# Patient Record
Sex: Female | Born: 1935 | Race: White | Hispanic: No | Smoking: Former smoker
Health system: Southern US, Community
[De-identification: ages and names within clinical notes are randomized; demographics above are authoritative.]

## PROBLEM LIST (undated history)

## (undated) DIAGNOSIS — S22000A Wedge compression fracture of unspecified thoracic vertebra, initial encounter for closed fracture: Secondary | ICD-10-CM

## (undated) DIAGNOSIS — E785 Hyperlipidemia, unspecified: Secondary | ICD-10-CM

## (undated) DIAGNOSIS — J449 Chronic obstructive pulmonary disease, unspecified: Secondary | ICD-10-CM

## (undated) DIAGNOSIS — N8110 Cystocele, unspecified: Secondary | ICD-10-CM

## (undated) DIAGNOSIS — I10 Essential (primary) hypertension: Secondary | ICD-10-CM

## (undated) DIAGNOSIS — R300 Dysuria: Secondary | ICD-10-CM

## (undated) DIAGNOSIS — K589 Irritable bowel syndrome without diarrhea: Secondary | ICD-10-CM

## (undated) DIAGNOSIS — E039 Hypothyroidism, unspecified: Secondary | ICD-10-CM

## (undated) DIAGNOSIS — R339 Retention of urine, unspecified: Secondary | ICD-10-CM

## (undated) DIAGNOSIS — H409 Unspecified glaucoma: Secondary | ICD-10-CM

## (undated) DIAGNOSIS — N368 Other specified disorders of urethra: Secondary | ICD-10-CM

## (undated) DIAGNOSIS — F32A Depression, unspecified: Secondary | ICD-10-CM

## (undated) DIAGNOSIS — F329 Major depressive disorder, single episode, unspecified: Secondary | ICD-10-CM

## (undated) HISTORY — DX: Essential (primary) hypertension: I10

## (undated) HISTORY — DX: Wedge compression fracture of unspecified thoracic vertebra, initial encounter for closed fracture: S22.000A

## (undated) HISTORY — DX: Major depressive disorder, single episode, unspecified: F32.9

## (undated) HISTORY — DX: Unspecified glaucoma: H40.9

## (undated) HISTORY — DX: Retention of urine, unspecified: R33.9

## (undated) HISTORY — PX: SHOULDER SURGERY: SHX246

## (undated) HISTORY — DX: Hypothyroidism, unspecified: E03.9

## (undated) HISTORY — DX: Chronic obstructive pulmonary disease, unspecified: J44.9

## (undated) HISTORY — DX: Depression, unspecified: F32.A

## (undated) HISTORY — DX: Cystocele, unspecified: N81.10

## (undated) HISTORY — DX: Irritable bowel syndrome, unspecified: K58.9

## (undated) HISTORY — DX: Other specified disorders of urethra: N36.8

## (undated) HISTORY — PX: CARDIAC CATHETERIZATION: SHX172

## (undated) HISTORY — DX: Dysuria: R30.0

## (undated) HISTORY — DX: Hyperlipidemia, unspecified: E78.5

---

## 2004-11-06 ENCOUNTER — Ambulatory Visit: Payer: Self-pay | Admitting: Unknown Physician Specialty

## 2005-01-11 ENCOUNTER — Inpatient Hospital Stay: Payer: Self-pay | Admitting: Unknown Physician Specialty

## 2005-02-07 ENCOUNTER — Encounter: Payer: Self-pay | Admitting: Unknown Physician Specialty

## 2005-02-08 ENCOUNTER — Encounter: Payer: Self-pay | Admitting: Unknown Physician Specialty

## 2005-03-11 ENCOUNTER — Encounter: Payer: Self-pay | Admitting: Unknown Physician Specialty

## 2005-04-11 ENCOUNTER — Encounter: Payer: Self-pay | Admitting: Unknown Physician Specialty

## 2005-05-10 ENCOUNTER — Encounter: Payer: Self-pay | Admitting: Unknown Physician Specialty

## 2005-05-24 ENCOUNTER — Ambulatory Visit: Payer: Self-pay | Admitting: Family Medicine

## 2005-06-09 ENCOUNTER — Encounter: Payer: Self-pay | Admitting: Unknown Physician Specialty

## 2006-04-25 ENCOUNTER — Ambulatory Visit: Payer: Self-pay | Admitting: Family Medicine

## 2006-05-27 ENCOUNTER — Ambulatory Visit: Payer: Self-pay | Admitting: Family Medicine

## 2006-06-03 ENCOUNTER — Ambulatory Visit: Payer: Self-pay | Admitting: Gastroenterology

## 2007-06-02 ENCOUNTER — Ambulatory Visit: Payer: Self-pay | Admitting: Internal Medicine

## 2008-03-17 ENCOUNTER — Emergency Department: Payer: Self-pay | Admitting: Emergency Medicine

## 2008-03-22 ENCOUNTER — Ambulatory Visit: Payer: Self-pay

## 2008-03-29 ENCOUNTER — Emergency Department: Payer: Self-pay | Admitting: Internal Medicine

## 2008-07-13 ENCOUNTER — Ambulatory Visit: Payer: Self-pay | Admitting: Internal Medicine

## 2009-06-21 ENCOUNTER — Ambulatory Visit: Payer: Self-pay | Admitting: Unknown Physician Specialty

## 2009-07-14 ENCOUNTER — Ambulatory Visit: Payer: Self-pay | Admitting: Internal Medicine

## 2010-07-16 ENCOUNTER — Ambulatory Visit: Payer: Self-pay | Admitting: Internal Medicine

## 2011-03-21 ENCOUNTER — Ambulatory Visit: Payer: Self-pay | Admitting: Internal Medicine

## 2011-06-18 ENCOUNTER — Other Ambulatory Visit: Payer: Self-pay | Admitting: Neurosurgery

## 2011-06-18 DIAGNOSIS — M545 Low back pain, unspecified: Secondary | ICD-10-CM

## 2011-06-28 ENCOUNTER — Ambulatory Visit
Admission: RE | Admit: 2011-06-28 | Discharge: 2011-06-28 | Disposition: A | Discharge status: Home / Self Care | Payer: Medicare Other | Source: Ambulatory Visit | Attending: Neurosurgery | Admitting: Neurosurgery

## 2011-06-28 DIAGNOSIS — M545 Low back pain, unspecified: Secondary | ICD-10-CM

## 2011-07-01 ENCOUNTER — Other Ambulatory Visit (HOSPITAL_COMMUNITY): Payer: Self-pay | Admitting: Internal Medicine

## 2012-03-16 ENCOUNTER — Ambulatory Visit: Payer: Self-pay | Admitting: Obstetrics and Gynecology

## 2013-03-22 ENCOUNTER — Emergency Department: Payer: Self-pay | Admitting: Emergency Medicine

## 2013-04-02 ENCOUNTER — Encounter: Payer: Self-pay | Admitting: Orthopedic Surgery

## 2013-04-11 ENCOUNTER — Encounter: Payer: Self-pay | Admitting: Orthopedic Surgery

## 2013-05-26 ENCOUNTER — Emergency Department: Payer: Self-pay | Admitting: Internal Medicine

## 2013-10-19 DIAGNOSIS — K5909 Other constipation: Secondary | ICD-10-CM | POA: Insufficient documentation

## 2013-12-28 DIAGNOSIS — K589 Irritable bowel syndrome without diarrhea: Secondary | ICD-10-CM | POA: Insufficient documentation

## 2013-12-28 DIAGNOSIS — J449 Chronic obstructive pulmonary disease, unspecified: Secondary | ICD-10-CM | POA: Insufficient documentation

## 2013-12-28 DIAGNOSIS — E785 Hyperlipidemia, unspecified: Secondary | ICD-10-CM | POA: Insufficient documentation

## 2013-12-28 DIAGNOSIS — E039 Hypothyroidism, unspecified: Secondary | ICD-10-CM | POA: Insufficient documentation

## 2014-01-25 ENCOUNTER — Ambulatory Visit: Payer: Self-pay | Admitting: Internal Medicine

## 2014-02-22 ENCOUNTER — Ambulatory Visit: Payer: Self-pay | Admitting: Gastroenterology

## 2014-03-15 DIAGNOSIS — R933 Abnormal findings on diagnostic imaging of other parts of digestive tract: Secondary | ICD-10-CM | POA: Insufficient documentation

## 2014-03-15 DIAGNOSIS — R1013 Epigastric pain: Secondary | ICD-10-CM | POA: Insufficient documentation

## 2014-03-24 DIAGNOSIS — R300 Dysuria: Secondary | ICD-10-CM | POA: Insufficient documentation

## 2014-04-19 ENCOUNTER — Ambulatory Visit: Payer: Self-pay | Admitting: Gastroenterology

## 2014-07-04 LAB — SURGICAL PATHOLOGY

## 2014-08-11 DIAGNOSIS — IMO0002 Reserved for concepts with insufficient information to code with codable children: Secondary | ICD-10-CM | POA: Insufficient documentation

## 2014-08-19 ENCOUNTER — Ambulatory Visit: Payer: Self-pay | Admitting: Urology

## 2014-09-15 ENCOUNTER — Ambulatory Visit: Payer: Self-pay | Admitting: Urology

## 2014-10-19 ENCOUNTER — Ambulatory Visit: Payer: Self-pay | Admitting: Urology

## 2014-11-15 ENCOUNTER — Ambulatory Visit (INDEPENDENT_AMBULATORY_CARE_PROVIDER_SITE_OTHER): Payer: Medicare Other | Admitting: Urology

## 2014-11-15 ENCOUNTER — Encounter: Payer: Self-pay | Admitting: Urology

## 2014-11-15 VITALS — BP 172/104 | HR 71 | Ht 64.0 in | Wt 128.4 lb

## 2014-11-15 DIAGNOSIS — N8110 Cystocele, unspecified: Secondary | ICD-10-CM | POA: Diagnosis not present

## 2014-11-15 DIAGNOSIS — N368 Other specified disorders of urethra: Secondary | ICD-10-CM | POA: Diagnosis not present

## 2014-11-15 LAB — BLADDER SCAN AMB NON-IMAGING

## 2014-11-15 NOTE — Progress Notes (Signed)
11/15/2014 11:09 AM   Erica Perry 07-22-1924 [id number]  Referring provider: No referring provider defined for this encounter.  Chief Complaint  Patient presents with  . 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 The urethral caruncle is no longer friable Scott significantly smaller less reddened with the Estrace cream once a week. She should continue this and see [place] in 6 months    PMH: Past Medical History  Diagnosis Date  . IBS (irritable bowel syndrome)   . Hyperlipidemia   . Hypothyroidism   . Depression   . Glaucoma   . Compression fracture of thoracic vertebra   . Dysuria   . COPD (chronic obstructive pulmonary disease)   . Urethral prolapse   . Female bladder prolapse   . Hypertension   . Incomplete emptying of bladder     Surgical History: Past Surgical History  Procedure Laterality Date  . Shoulder surgery      Home Medications:    Medication List       This list is accurate as of: 11/15/14 11:09 AM.  Always use your most recent med list.               aspirin 81 MG tablet  Take 81 mg by mouth daily.     CALCIUM-VITAMIN D PO  Take 1 tablet by mouth daily.     clorazepate 7.5 MG tablet  Commonly known as:  TRANXENE  Take 7.5 mg by mouth 2 (two) times daily as needed for anxiety.     dorzolamide-timolol 22.3-6.8 MG/ML ophthalmic solution  Commonly known as:  COSOPT  1 drop 2 (two) times daily.     fluticasone 50 MCG/ACT nasal spray  Commonly known as:  FLONASE  Place into both nostrils daily.     levothyroxine 75 MCG tablet  Commonly known as:  SYNTHROID, LEVOTHROID  Take 75 mcg by mouth daily before breakfast.     MIRALAX PO  Take by mouth.     omeprazole 20 MG capsule  Commonly known as:  PRILOSEC  Take 20 mg by mouth daily.     simvastatin 40 MG tablet  Commonly known as:  ZOCOR  Take 40 mg by mouth daily.        Allergies:  Allergies  Allergen Reactions  . Meperidine  And Related   . Midazolam Hcl   . Sulfa Antibiotics   . Aleve [Naproxen Sodium] Rash  . Codeine Sulfate Rash    Family History: Family History  Problem Relation Age of Onset  . Prostate cancer Neg Hx   . Kidney cancer Neg Hx   . Bladder Cancer Neg Hx     Social History:  reports that she quit smoking about 15 months ago. Her smoking use included Cigarettes. She has a 15 pack-year smoking history. She does not have any smokeless tobacco history on file. She reports that she does not drink alcohol. Her drug history is not on file.  ROS: UROLOGY Frequent Urination?: No Hard to postpone urination?: No Burning/pain with urination?: No Get up at night to urinate?: Yes Leakage of urine?: No Urine stream starts and stops?: No Trouble starting stream?: No Do you have to strain to urinate?: Yes Blood in urine?: No Urinary tract infection?: No Sexually transmitted disease?: No Injury to kidneys or bladder?: No Painful intercourse?: No Weak stream?: No Currently pregnant?: No Vaginal bleeding?: No Last menstrual period?: n  Gastrointestinal Nausea?: No Vomiting?: No Indigestion/heartburn?: Yes Diarrhea?: No  Constipation?: Yes  Constitutional Fever: No Night sweats?: No Weight loss?: No Fatigue?: No  Skin Skin rash/lesions?: No Itching?: No  Eyes Blurred vision?: No Double vision?: Yes  Ears/Nose/Throat Sore throat?: No Sinus problems?: No  Hematologic/Lymphatic Swollen glands?: No Easy bruising?: No  Cardiovascular Leg swelling?: No Chest pain?: No  Respiratory Cough?: No Shortness of breath?: No  Endocrine Excessive thirst?: No  Musculoskeletal Back pain?: Yes Joint pain?: No  Neurological Headaches?: No Dizziness?: No  Psychologic Depression?: No Anxiety?: Yes  Physical Exam: BP 172/104 mmHg  Pulse 71  Ht 5\' 4"  (1.626 m)  Wt 128 lb 6.4 oz (58.242 kg)  BMI 22.03 kg/m2  Constitutional:  Alert and oriented, No acute distress. HEENT:  [place] AT, moist mucus membranes.  Trachea midline, no masses. Cardiovascular: No clubbing, cyanosis, or edema. Respiratory: Normal respiratory effort, no increased work of breathing. GI: Abdomen is soft, nontender, nondistended, no abdominal masses GU: No CVA tenderness. Smaller urethral carbuncle atrophic senile vaginitis Skin: No rashes, bruises or suspicious lesions. Lymph: No cervical or inguinal adenopathy. Neurologic: Grossly intact, no focal deficits, moving all 4 extremities. Psychiatric: Normal mood and affect.  Laboratory Data: No results found for: WBC, HGB, HCT, MCV, PLT  No results found for: CREATININE  No results found for: PSA  No results found for: TESTOSTERONE  No results found for: HGBA1C  Urinalysis No results found for: COLORURINE, APPEARANCEUR, LABSPEC, PHURINE, GLUCOSEU, HGBUR, BILIRUBINUR, KETONESUR, PROTEINUR, UROBILINOGEN, NITRITE, LEUKOCYTESUR  Pertinent Imaging: None  Assessment & Plan:  Stable urethral carbuncle area patient unable to void here today although she voids well at home. She is taking her urinalysis cup home with her and we'll try to void at her house and then bring her sample in here tomorrow follow-up is in 6 months. She is to use her Estrace cream once weekly. Her caruncle has improved immensely is no longer friable  1. Female bladder prolapse Urethral carbuncle no bladder prolapse - Urinalysis, Complete - BLADDER SCAN AMB NON-IMAGING   No Follow-up on file.  Lorraine Lax, MD  [facility] [address] [place], [place] [address] [phone]

## 2014-11-16 LAB — MICROSCOPIC EXAMINATION
Bacteria, UA: NONE SEEN
RBC, UA: NONE SEEN /hpf (ref 0–?)
WBC, UA: NONE SEEN /hpf (ref 0–?)

## 2014-11-16 LAB — URINALYSIS, COMPLETE
Bilirubin, UA: NEGATIVE
GLUCOSE, UA: NEGATIVE
KETONES UA: NEGATIVE
LEUKOCYTES UA: NEGATIVE
Nitrite, UA: NEGATIVE
PROTEIN UA: NEGATIVE
RBC, UA: NEGATIVE
SPEC GRAV UA: 1.01 (ref 1.005–1.030)
Urobilinogen, Ur: 0.2 mg/dL (ref 0.2–1.0)
pH, UA: 6.5 (ref 5.0–7.5)

## 2015-02-03 ENCOUNTER — Encounter: Payer: Self-pay | Admitting: Emergency Medicine

## 2015-02-03 ENCOUNTER — Emergency Department: Payer: Medicare Other

## 2015-02-03 ENCOUNTER — Emergency Department
Admission: EM | Admit: 2015-02-03 | Discharge: 2015-02-03 | Disposition: A | Discharge status: Home / Self Care | Payer: Medicare Other | Attending: Emergency Medicine | Admitting: Emergency Medicine

## 2015-02-03 DIAGNOSIS — M7918 Myalgia, other site: Secondary | ICD-10-CM

## 2015-02-03 DIAGNOSIS — S8992XA Unspecified injury of left lower leg, initial encounter: Secondary | ICD-10-CM | POA: Diagnosis present

## 2015-02-03 DIAGNOSIS — Y998 Other external cause status: Secondary | ICD-10-CM | POA: Diagnosis not present

## 2015-02-03 DIAGNOSIS — W19XXXA Unspecified fall, initial encounter: Secondary | ICD-10-CM

## 2015-02-03 DIAGNOSIS — I10 Essential (primary) hypertension: Secondary | ICD-10-CM | POA: Insufficient documentation

## 2015-02-03 DIAGNOSIS — W102XXA Fall (on)(from) incline, initial encounter: Secondary | ICD-10-CM | POA: Insufficient documentation

## 2015-02-03 DIAGNOSIS — Y9301 Activity, walking, marching and hiking: Secondary | ICD-10-CM | POA: Diagnosis not present

## 2015-02-03 DIAGNOSIS — Z79899 Other long term (current) drug therapy: Secondary | ICD-10-CM | POA: Insufficient documentation

## 2015-02-03 DIAGNOSIS — Y9289 Other specified places as the place of occurrence of the external cause: Secondary | ICD-10-CM | POA: Insufficient documentation

## 2015-02-03 DIAGNOSIS — S80211A Abrasion, right knee, initial encounter: Secondary | ICD-10-CM | POA: Diagnosis not present

## 2015-02-03 DIAGNOSIS — S80212A Abrasion, left knee, initial encounter: Secondary | ICD-10-CM | POA: Diagnosis not present

## 2015-02-03 DIAGNOSIS — Z7982 Long term (current) use of aspirin: Secondary | ICD-10-CM | POA: Insufficient documentation

## 2015-02-03 DIAGNOSIS — Z87891 Personal history of nicotine dependence: Secondary | ICD-10-CM | POA: Insufficient documentation

## 2015-02-03 MED ORDER — TRAMADOL HCL 50 MG PO TABS: 50.0000 mg | ORAL_TABLET | Freq: Four times a day (QID) | ORAL | Status: DC | PRN

## 2015-02-03 MED ORDER — IBUPROFEN 600 MG PO TABS
600.0000 mg | ORAL_TABLET | Freq: Once | ORAL | Status: AC
  Administered 2015-02-03: 600 mg via ORAL
  Filled 2015-02-03: qty 1

## 2015-02-03 MED ORDER — TRAMADOL HCL 50 MG PO TABS
50.0000 mg | ORAL_TABLET | Freq: Once | ORAL | Status: AC
  Administered 2015-02-03: 50 mg via ORAL
  Filled 2015-02-03: qty 1

## 2015-02-03 NOTE — Discharge Instructions (Signed)

## 2015-02-03 NOTE — ED Notes (Signed)
Pt went to X-Ray.  

## 2015-02-03 NOTE — ED Provider Notes (Signed)
[facility] Emergency Department Provider Note  ____________________________________________  Time seen: Approximately 514 AM  I have reviewed the triage vital signs and the nursing notes.   HISTORY  Chief Complaint Fall    HPI Erica Perry is a 81 y.o. female who comes into the hospital today after a fall. The patient reports that she was coming down a ramp that she had previously walked up without any difficulty and there was place on the ramp that was not level. The patient reports that she lost her footing and she fell. The patient reports that she did not hit her head but she fell on her knees and rolled onto her left side. The patient reports that initially she felt fine and was able to go through the rest of the day without any difficulty. She reports that this occurred approximate 5:30. She reports that she took some Tylenol but after she was taken home she's been up all night with pain. She reports that 7 pain in her bilateral knees and on the right side of her chest under her arm. She reports her pain as a 7-8 out of 10 in intensity. The patient reports that she is unable to sleep so she decided to come in for evaluation.   Past Medical History  Diagnosis Date  . IBS (irritable bowel syndrome)   . Hyperlipidemia   . Hypothyroidism   . Depression   . Glaucoma   . Compression fracture of thoracic vertebra (HCC)   . Dysuria   . COPD (chronic obstructive pulmonary disease) (HCC)   . Urethral prolapse   . Female bladder prolapse   . Hypertension   . Incomplete emptying of bladder     Patient Active Problem List   Diagnosis Date Noted  . Compression fracture 08/11/2014  . Difficult or painful urination 03/24/2014  . Abnormal finding on GI tract imaging 03/15/2014  . Abdominal pain, epigastric 03/15/2014  . CAFL (chronic airflow limitation) (HCC) 12/28/2013  . HLD (hyperlipidemia) 12/28/2013  . Adult hypothyroidism 12/28/2013  . Adaptive  colitis 12/28/2013  . Chronic constipation 10/19/2013    Past Surgical History  Procedure Laterality Date  . Shoulder surgery      Current Outpatient Rx  Name  Route  Sig  Dispense  Refill  . aspirin 81 MG tablet   Oral   Take 81 mg by mouth daily.         Marland Kitchen CALCIUM-VITAMIN D PO   Oral   Take 1 tablet by mouth daily.         . clorazepate (TRANXENE) 7.5 MG tablet   Oral   Take 7.5 mg by mouth 2 (two) times daily as needed for anxiety.         . dorzolamide-timolol (COSOPT) 22.3-6.8 MG/ML ophthalmic solution      1 drop 2 (two) times daily.         . fluticasone (FLONASE) 50 MCG/ACT nasal spray   Each Nare   Place into both nostrils daily.         Marland Kitchen levothyroxine (SYNTHROID, LEVOTHROID) 75 MCG tablet   Oral   Take 75 mcg by mouth daily before breakfast.         . omeprazole (PRILOSEC) 20 MG capsule   Oral   Take 20 mg by mouth daily.         . Polyethylene Glycol 3350 (MIRALAX PO)   Oral   Take by mouth.         Marland Kitchen  simvastatin (ZOCOR) 40 MG tablet   Oral   Take 40 mg by mouth daily.         . traMADol (ULTRAM) 50 MG tablet   Oral   Take 1 tablet (50 mg total) by mouth every 6 (six) hours as needed.   12 tablet   0     Allergies Meperidine and related; Midazolam hcl; Sulfa antibiotics; Aleve; and Codeine sulfate  Family History  Problem Relation Age of Onset  . Prostate cancer Neg Hx   . Kidney cancer Neg Hx   . Bladder Cancer Neg Hx     Social History Social History  Substance Use Topics  . Smoking status: Former Smoker -- 0.50 packs/day for 30 years    Types: Cigarettes    Quit date: 08/09/2013  . Smokeless tobacco: None  . Alcohol Use: No    Review of Systems Constitutional: No fever/chills Eyes: No visual changes. ENT: No sore throat. Cardiovascular: Right-sided chest pain Respiratory: Denies shortness of breath. Gastrointestinal: No abdominal pain.  No nausea, no vomiting.  No diarrhea.  No  constipation. Genitourinary: Negative for dysuria. Musculoskeletal: Bilateral knee pain Skin: Bilateral knee abrasions Neurological: Negative for headaches, focal weakness or numbness. 10-point ROS otherwise negative.  ____________________________________________   PHYSICAL EXAM:  VITAL SIGNS: ED Triage Vitals  Enc Vitals Group     BP 02/03/15 0456 172/87 mmHg     Pulse Rate 02/03/15 0456 95     Resp 02/03/15 0456 18     Temp 02/03/15 0456 97.7 F (36.5 C)     Temp Source 02/03/15 0456 Oral     SpO2 02/03/15 0456 96 %     Weight 02/03/15 0456 128 lb 12 oz (58.4 kg)     Height 02/03/15 0456 5\' 4"  (1.626 m)     Head Cir --      Peak Flow --      Pain Score 02/03/15 0457 8     Pain Loc --      Pain Edu? --      Excl. in GC? --     Constitutional: Alert and oriented. Well appearing and in mild distress. Eyes: Conjunctivae are normal. PERRL. EOMI. Head: Atraumatic. Nose: No congestion/rhinnorhea. Mouth/Throat: Mucous membranes are moist.  Oropharynx non-erythematous. Cardiovascular: Normal rate, regular rhythm. Grossly normal heart sounds.  Good peripheral circulation. Respiratory: Normal respiratory effort.  No retractions. Lungs CTAB. Gastrointestinal: Soft and nontender. No distention. Positive bowel sounds Musculoskeletal: Bilateral knee tenderness palpation with abrasions bilaterally. Neurologic:  Normal speech and language.  Skin:  Abrasions to bilateral knees with no contusion to right chest wall. Psychiatric: Mood and affect are normal.   ____________________________________________   LABS (all labs ordered are listed, but only abnormal results are displayed)  Labs Reviewed - No data to display ____________________________________________  EKG  ED ECG REPORT I, Rebecka Apley, the attending physician, personally viewed and interpreted this ECG.   Date: 02/03/2015  EKG Time: 457  Rate: 94  Rhythm: normal sinus rhythm  Axis: Normal  Intervals:left  anterior fascicular block  ST&T Change: None  ____________________________________________  RADIOLOGY  Right knee x-ray: No evidence of fracture or dislocation, scattered vascular calcifications seen Left knee x-ray: No evidence of fracture or dislocation scattered vascular calcifications seen Chest x-ray: Mild bibasilar atelectasis noted, lungs otherwise clear, no definite displaced rib fracture seen. ____________________________________________   PROCEDURES  Procedure(s) performed: None  Critical Care performed: No  ____________________________________________   INITIAL IMPRESSION / ASSESSMENT AND PLAN / ED  COURSE  Pertinent labs & imaging results that were available during my care of the patient were reviewed by me and considered in my medical decision making (see chart for details).  This is an 81 year old female who comes in today after a fall this afternoon. The patient complaining of pain in her right side as well as her bilateral knees down her legs. The patient does not have any acute fracture at this time. I feel the patient is 6 be discharged home and can follow up with her primary care physician. I did give the patient a dose of tramadol as well as some ibuprofen for her pain. The patient will be discharged to follow-up. ____________________________________________   FINAL CLINICAL IMPRESSION(S) / ED DIAGNOSES  Final diagnoses:  Musculoskeletal pain  Fall, initial encounter      Rebecka Apley, MD 02/03/15 [phone]

## 2015-02-03 NOTE — ED Notes (Signed)
Pt returned from X Ray.

## 2015-02-03 NOTE — ED Notes (Signed)
Pt arrived to the ED via EMS for complaints of bilateral lower leg pain and right flank pain secondary to a mechanical fall sustained yesterday. Pt reports that she fell after a miss step on uneven pavement. Pt denies LOC or any other symptom. Pt is AOx4 in no apparent distress.

## 2015-05-05 ENCOUNTER — Encounter: Payer: Self-pay | Admitting: *Deleted

## 2015-05-15 ENCOUNTER — Ambulatory Visit: Payer: Medicare Other | Admitting: Obstetrics and Gynecology

## 2015-10-03 ENCOUNTER — Other Ambulatory Visit: Payer: Self-pay | Admitting: Internal Medicine

## 2015-10-03 DIAGNOSIS — Z1231 Encounter for screening mammogram for malignant neoplasm of breast: Secondary | ICD-10-CM

## 2015-10-23 ENCOUNTER — Ambulatory Visit: Payer: Medicare Other

## 2015-11-03 ENCOUNTER — Other Ambulatory Visit: Payer: Self-pay | Admitting: Internal Medicine

## 2015-11-03 ENCOUNTER — Ambulatory Visit
Admission: RE | Admit: 2015-11-03 | Discharge: 2015-11-03 | Disposition: A | Discharge status: Home / Self Care | Payer: Medicare Other | Source: Ambulatory Visit | Attending: Internal Medicine | Admitting: Internal Medicine

## 2015-11-03 DIAGNOSIS — Z1231 Encounter for screening mammogram for malignant neoplasm of breast: Secondary | ICD-10-CM

## 2016-03-02 ENCOUNTER — Emergency Department: Payer: Medicare Other

## 2016-03-02 ENCOUNTER — Encounter: Payer: Self-pay | Admitting: Medical Oncology

## 2016-03-02 ENCOUNTER — Emergency Department
Admission: EM | Admit: 2016-03-02 | Discharge: 2016-03-02 | Disposition: A | Discharge status: Home / Self Care | Payer: Medicare Other | Attending: Student in an Organized Health Care Education/Training Program | Admitting: Student in an Organized Health Care Education/Training Program

## 2016-03-02 DIAGNOSIS — E039 Hypothyroidism, unspecified: Secondary | ICD-10-CM | POA: Insufficient documentation

## 2016-03-02 DIAGNOSIS — I10 Essential (primary) hypertension: Secondary | ICD-10-CM | POA: Diagnosis not present

## 2016-03-02 DIAGNOSIS — Z79899 Other long term (current) drug therapy: Secondary | ICD-10-CM | POA: Insufficient documentation

## 2016-03-02 DIAGNOSIS — Z7982 Long term (current) use of aspirin: Secondary | ICD-10-CM | POA: Insufficient documentation

## 2016-03-02 DIAGNOSIS — J449 Chronic obstructive pulmonary disease, unspecified: Secondary | ICD-10-CM | POA: Diagnosis not present

## 2016-03-02 DIAGNOSIS — Z87891 Personal history of nicotine dependence: Secondary | ICD-10-CM | POA: Diagnosis not present

## 2016-03-02 DIAGNOSIS — R0789 Other chest pain: Secondary | ICD-10-CM | POA: Diagnosis not present

## 2016-03-02 DIAGNOSIS — K5900 Constipation, unspecified: Secondary | ICD-10-CM | POA: Diagnosis not present

## 2016-03-02 DIAGNOSIS — R1013 Epigastric pain: Secondary | ICD-10-CM | POA: Diagnosis present

## 2016-03-02 LAB — BASIC METABOLIC PANEL
ANION GAP: 11 (ref 5–15)
BUN: <5 mg/dL — ABNORMAL LOW (ref 6–20)
CHLORIDE: 95 mmol/L — AB (ref 101–111)
CO2: 24 mmol/L (ref 22–32)
Calcium: 9 mg/dL (ref 8.9–10.3)
Creatinine, Ser: 0.54 mg/dL (ref 0.44–1.00)
GFR calc non Af Amer: >60 mL/min (ref >60)
Glucose, Bld: 113 mg/dL — ABNORMAL HIGH (ref 65–99)
POTASSIUM: 3.3 mmol/L — AB (ref 3.5–5.1)
SODIUM: 130 mmol/L — AB (ref 135–145)

## 2016-03-02 LAB — CBC
HEMATOCRIT: 39.6 % (ref 35.0–47.0)
Hemoglobin: 13.5 g/dL (ref 12.0–16.0)
MCH: 29.9 pg (ref 26.0–34.0)
MCHC: 34 g/dL (ref 32.0–36.0)
MCV: 88.1 fL (ref 80.0–100.0)
PLATELETS: 340 10*3/uL (ref 150–440)
RBC: 4.5 MIL/uL (ref 3.80–5.20)
RDW: 13.6 % (ref 11.5–14.5)
WBC: 13.6 10*3/uL — AB (ref 3.6–11.0)

## 2016-03-02 LAB — TROPONIN I: Troponin I: <0.03 ng/mL (ref <0.03)

## 2016-03-02 MED ORDER — MAGNESIUM CITRATE PO SOLN
1.0000 | Freq: Once | ORAL | Status: AC
Start: 2016-03-02 — End: 2016-03-02
  Administered 2016-03-02: 1 via ORAL
  Filled 2016-03-02: qty 296

## 2016-03-02 MED ORDER — DIPHENHYDRAMINE HCL 25 MG PO CAPS
25.0000 mg | ORAL_CAPSULE | Freq: Once | ORAL | Status: AC
  Administered 2016-03-02: 25 mg via ORAL
  Filled 2016-03-02: qty 1

## 2016-03-02 MED ORDER — ONDANSETRON HCL 4 MG/2ML IJ SOLN
4.0000 mg | Freq: Once | INTRAMUSCULAR | Status: AC
  Administered 2016-03-02: 4 mg via INTRAVENOUS
  Filled 2016-03-02: qty 2

## 2016-03-02 MED ORDER — BISACODYL 10 MG RE SUPP
10.0000 mg | Freq: Once | RECTAL | Status: AC
  Administered 2016-03-02: 10 mg via RECTAL
  Filled 2016-03-02 (×2): qty 1

## 2016-03-02 MED ORDER — BISACODYL 5 MG PO TBEC: 5.0000 mg | DELAYED_RELEASE_TABLET | Freq: Every day | ORAL | 0 refills | Status: AC | PRN

## 2016-03-02 MED ORDER — IOPAMIDOL (ISOVUE-300) INJECTION 61%
100.0000 mL | Freq: Once | INTRAVENOUS | Status: AC | PRN
  Administered 2016-03-02: 100 mL via INTRAVENOUS

## 2016-03-02 MED ORDER — IPRATROPIUM-ALBUTEROL 0.5-2.5 (3) MG/3ML IN SOLN
3.0000 mL | Freq: Once | RESPIRATORY_TRACT | Status: AC
  Administered 2016-03-02: 3 mL via RESPIRATORY_TRACT
  Filled 2016-03-02: qty 3

## 2016-03-02 NOTE — ED Provider Notes (Signed)
[facility] Emergency Department Provider Note    First MD Initiated Contact with Patient 03/02/16 1559     (approximate)  I have reviewed the triage vital signs and the nursing notes.   HISTORY  Chief Complaint Chest Pain; Back Pain; and Constipation    HPI Erica Perry is a 82 y.o. female with a history of COPD presents with 3 days of constipation as well as epigastric discomfort causing pain in her midsternal area radiating through to her back. She denies any worsening shortness of breath. Does have a history of COPD. No recent fevers. Denies any nausea or vomiting. Denies any pleuritic chest pain. No pain worsened with exertion. No diaphoresis or pain radiating to her jaw or right shoulder.   Past Medical History:  Diagnosis Date  . Compression fracture of thoracic vertebra (HCC)   . COPD (chronic obstructive pulmonary disease) (HCC)   . Depression   . Dysuria   . Female bladder prolapse   . Glaucoma   . Hyperlipidemia   . Hypertension   . Hypothyroidism   . IBS (irritable bowel syndrome)   . Incomplete emptying of bladder   . Urethral prolapse    Family History  Problem Relation Age of Onset  . Breast cancer Other 42  . Prostate cancer Neg Hx   . Kidney cancer Neg Hx   . Bladder Cancer Neg Hx    Past Surgical History:  Procedure Laterality Date  . SHOULDER SURGERY     Patient Active Problem List   Diagnosis Date Noted  . Compression fracture 08/11/2014  . Difficult or painful urination 03/24/2014  . Abnormal finding on GI tract imaging 03/15/2014  . Abdominal pain, epigastric 03/15/2014  . CAFL (chronic airflow limitation) (HCC) 12/28/2013  . HLD (hyperlipidemia) 12/28/2013  . Adult hypothyroidism 12/28/2013  . Adaptive colitis 12/28/2013  . Chronic obstructive pulmonary disease (HCC) 12/28/2013  . Chronic constipation 10/19/2013      Prior to Admission medications   Medication Sig Start Date End Date Taking?  Authorizing Provider  aspirin 81 MG tablet Take 81 mg by mouth daily.    Historical Provider, MD  CALCIUM-VITAMIN D PO Take 1 tablet by mouth daily.    Historical Provider, MD  clorazepate (TRANXENE) 7.5 MG tablet Take 7.5 mg by mouth 2 (two) times daily as needed for anxiety.    Historical Provider, MD  dorzolamide-timolol (COSOPT) 22.3-6.8 MG/ML ophthalmic solution 1 drop 2 (two) times daily.    Historical Provider, MD  fluticasone (FLONASE) 50 MCG/ACT nasal spray Place into both nostrils daily.    Historical Provider, MD  levothyroxine (SYNTHROID, LEVOTHROID) 75 MCG tablet Take 75 mcg by mouth daily before breakfast.    Historical Provider, MD  omeprazole (PRILOSEC) 20 MG capsule Take 20 mg by mouth daily.    Historical Provider, MD  Polyethylene Glycol 3350 (MIRALAX PO) Take by mouth.    Historical Provider, MD  simvastatin (ZOCOR) 40 MG tablet Take 40 mg by mouth daily.    Historical Provider, MD  traMADol (ULTRAM) 50 MG tablet Take 1 tablet (50 mg total) by mouth every 6 (six) hours as needed. 02/03/15   Rebecka Apley, MD    Allergies Meperidine and related; Midazolam hcl; Sulfa antibiotics; Aleve [naproxen sodium]; and Codeine sulfate    Social History Social History  Substance Use Topics  . Smoking status: Former Smoker    Packs/day: 0.50    Years: 30.00    Types: Cigarettes    Quit date:  08/09/2013  . Smokeless tobacco: Not on file  . Alcohol use No    Review of Systems Patient denies headaches, rhinorrhea, blurry vision, numbness, shortness of breath, chest pain, edema, cough, abdominal pain, nausea, vomiting, diarrhea, dysuria, fevers, rashes or hallucinations unless otherwise stated above in HPI. ____________________________________________   PHYSICAL EXAM:  VITAL SIGNS: Vitals:   03/02/16 1420  BP: (!) 189/102  Pulse: 92  Resp: 18  Temp: 98 F (36.7 C)    Constitutional: Alert and oriented. Well appearing and in no acute distress. Eyes: Conjunctivae are  normal. PERRL. EOMI. Head: Atraumatic. Nose: No congestion/rhinnorhea. Mouth/Throat: Mucous membranes are moist.  Oropharynx non-erythematous. Neck: No stridor. Painless ROM. No cervical spine tenderness to palpation Hematological/Lymphatic/Immunilogical: No cervical lymphadenopathy. Cardiovascular: Normal rate, regular rhythm. Grossly normal heart sounds.  Good peripheral circulation. Respiratory: Normal respiratory effort.  No retractions. Lungs CTAB. Gastrointestinal: Soft and nontender. No distention. No abdominal bruits. No CVA tenderness.  No rectal mass, there is soft formed stool in the rectum Musculoskeletal: No lower extremity tenderness nor edema.  No joint effusions. Neurologic:  Normal speech and language. No gross focal neurologic deficits are appreciated. No gait instability. Skin:  Skin is warm, dry and intact. No rash noted. Psychiatric: Mood and affect are normal. Speech and behavior are normal.  ____________________________________________   LABS (all labs ordered are listed, but only abnormal results are displayed)  Results for orders placed or performed during the hospital encounter of 03/02/16 (from the past 24 hour(s))  Basic metabolic panel     Status: Abnormal   Collection Time: 03/02/16  2:23 PM  Result Value Ref Range   Sodium 130 (L) 135 - 145 mmol/L   Potassium 3.3 (L) 3.5 - 5.1 mmol/L   Chloride 95 (L) 101 - 111 mmol/L   CO2 24 22 - 32 mmol/L   Glucose, Bld 113 (H) 65 - 99 mg/dL   BUN <5 (L) 6 - 20 mg/dL   Creatinine, Ser 1.61 0.44 - 1.00 mg/dL   Calcium 9.0 8.9 - 09.6 mg/dL   GFR calc non Af Amer >60 >60 mL/min   GFR calc Af Amer >60 >60 mL/min   Anion gap 11 5 - 15  CBC     Status: Abnormal   Collection Time: 03/02/16  2:23 PM  Result Value Ref Range   WBC 13.6 (H) 3.6 - 11.0 K/uL   RBC 4.50 3.80 - 5.20 MIL/uL   Hemoglobin 13.5 12.0 - 16.0 g/dL   HCT 04.5 40.9 - 81.1 %   MCV 88.1 80.0 - 100.0 fL   MCH 29.9 26.0 - 34.0 pg   MCHC 34.0 32.0 -  36.0 g/dL   RDW 91.4 78.2 - 95.6 %   Platelets 340 150 - 440 K/uL  Troponin I     Status: None   Collection Time: 03/02/16  2:23 PM  Result Value Ref Range   Troponin I <0.03 <0.03 ng/mL   ____________________________________________  EKG My review and personal interpretation at Time: 14:25   Indication: chest pain  Rate: 90  Rhythm: sinus Axis: normal Other: non specific st changes, normal intervals. ____________________________________________  RADIOLOGY  I personally reviewed all radiographic images ordered to evaluate for the above acute complaints and reviewed radiology reports and findings.  These findings were personally discussed with the patient.  Please see medical record for radiology report.  ____________________________________________   PROCEDURES  Procedure(s) performed:  Procedures    Critical Care performed: no ____________________________________________   INITIAL IMPRESSION / ASSESSMENT AND  PLAN / ED COURSE  Pertinent labs & imaging results that were available during my care of the patient were reviewed by me and considered in my medical decision making (see chart for details).  DDX: acs, pna, copd, obstruction,   Erica Perry is a 82 y.o. who presents to the ED with above complaints.  No evidence of stool impaction on rectal exam. EKG without any evidence of acute ischemia and troponin is negative. Patient does have mild leukocytosis. No evidence of hypoxia. She has no signs or symptoms of aortic dissection and she has a crisp aortic knob with equal pulses bilaterally no neuro deficits. Based on her constipation without fecal impaction I am concerned for possible diverticulitis or obstructive lesion. We'll order CT scan to evaluate.  Clinical Course as of Mar 02 2024  Sat Mar 02, 2016  1843 Patient with repeat troponin that is negative.  Patient in NAD.  Repeat abdominal exam is soft and benign.  Leukocytosis is of uncertain etiology.  No evidence  of acute infectious process.  Patient was able to tolerate PO and was able to ambulate with a steady gait.  Have discussed with the patient and available family all diagnostics and treatments performed thus far and all questions were answered to the best of my ability. The patient demonstrates understanding and agreement with plan.   [PR]    Clinical Course User Index [PR] Willy EddyPatrick Janelis Stelzer, MD     ____________________________________________   FINAL CLINICAL IMPRESSION(S) / ED DIAGNOSES  Final diagnoses:  Constipation, unspecified constipation type  Atypical chest pain      NEW MEDICATIONS STARTED DURING THIS VISIT:  New Prescriptions   No medications on file     Note:  This document was prepared using Dragon voice recognition software and may include unintentional dictation errors.    Willy EddyPatrick Louie Flenner, MD 03/02/16 2025

## 2016-03-02 NOTE — ED Triage Notes (Signed)
Pt reports that she began having chest pressure this am with radiation of pain in between shoulder blades. Pt reports that she feels like she is constipated and that is what is causing the pain. Pt has not had BM in 3 days. Pt denies sob.

## 2016-03-02 NOTE — ED Notes (Signed)
Pt. Going home with neighbor.

## 2016-03-09 ENCOUNTER — Emergency Department: Payer: Medicare Other

## 2016-03-09 ENCOUNTER — Emergency Department
Admission: EM | Admit: 2016-03-09 | Discharge: 2016-03-09 | Disposition: A | Discharge status: Home / Self Care | Payer: Medicare Other | Attending: Emergency Medicine | Admitting: Emergency Medicine

## 2016-03-09 DIAGNOSIS — R0789 Other chest pain: Secondary | ICD-10-CM | POA: Insufficient documentation

## 2016-03-09 DIAGNOSIS — G8929 Other chronic pain: Secondary | ICD-10-CM

## 2016-03-09 DIAGNOSIS — J449 Chronic obstructive pulmonary disease, unspecified: Secondary | ICD-10-CM | POA: Diagnosis not present

## 2016-03-09 DIAGNOSIS — E871 Hypo-osmolality and hyponatremia: Secondary | ICD-10-CM

## 2016-03-09 DIAGNOSIS — Z79899 Other long term (current) drug therapy: Secondary | ICD-10-CM | POA: Insufficient documentation

## 2016-03-09 DIAGNOSIS — E039 Hypothyroidism, unspecified: Secondary | ICD-10-CM | POA: Diagnosis not present

## 2016-03-09 DIAGNOSIS — M549 Dorsalgia, unspecified: Secondary | ICD-10-CM | POA: Diagnosis present

## 2016-03-09 DIAGNOSIS — Z87891 Personal history of nicotine dependence: Secondary | ICD-10-CM | POA: Diagnosis not present

## 2016-03-09 DIAGNOSIS — I10 Essential (primary) hypertension: Secondary | ICD-10-CM | POA: Insufficient documentation

## 2016-03-09 DIAGNOSIS — M546 Pain in thoracic spine: Secondary | ICD-10-CM | POA: Diagnosis not present

## 2016-03-09 LAB — BASIC METABOLIC PANEL
Anion gap: 10 (ref 5–15)
BUN: <5 mg/dL — ABNORMAL LOW (ref 6–20)
CALCIUM: 8.5 mg/dL — AB (ref 8.9–10.3)
CHLORIDE: 91 mmol/L — AB (ref 101–111)
CO2: 26 mmol/L (ref 22–32)
CREATININE: 0.58 mg/dL (ref 0.44–1.00)
Glucose, Bld: 104 mg/dL — ABNORMAL HIGH (ref 65–99)
Potassium: 3.5 mmol/L (ref 3.5–5.1)
SODIUM: 127 mmol/L — AB (ref 135–145)

## 2016-03-09 LAB — CBC
HCT: 39.7 % (ref 35.0–47.0)
Hemoglobin: 13.6 g/dL (ref 12.0–16.0)
MCH: 29.9 pg (ref 26.0–34.0)
MCHC: 34.2 g/dL (ref 32.0–36.0)
MCV: 87.4 fL (ref 80.0–100.0)
PLATELETS: 326 10*3/uL (ref 150–440)
RBC: 4.54 MIL/uL (ref 3.80–5.20)
RDW: 13.9 % (ref 11.5–14.5)
WBC: 10.3 10*3/uL (ref 3.6–11.0)

## 2016-03-09 LAB — TROPONIN I

## 2016-03-09 MED ORDER — TRAMADOL HCL 50 MG PO TABS: 50.0000 mg | ORAL_TABLET | Freq: Once | ORAL | Status: DC

## 2016-03-09 MED ORDER — ACETAMINOPHEN 325 MG PO TABS
650.0000 mg | ORAL_TABLET | Freq: Once | ORAL | Status: AC
  Administered 2016-03-09: 650 mg via ORAL
  Filled 2016-03-09: qty 2

## 2016-03-09 MED ORDER — TRAMADOL HCL 50 MG PO TABS: 50.0000 mg | ORAL_TABLET | Freq: Four times a day (QID) | ORAL | 0 refills | Status: AC | PRN

## 2016-03-09 MED ORDER — DOCUSATE SODIUM 100 MG PO CAPS: 100.0000 mg | ORAL_CAPSULE | Freq: Every day | ORAL | 2 refills | Status: AC | PRN

## 2016-03-09 MED ORDER — SODIUM CHLORIDE 0.9 % IV BOLUS (SEPSIS)
1000.0000 mL | Freq: Once | INTRAVENOUS | Status: AC
Start: 2016-03-09 — End: 2016-03-09
  Administered 2016-03-09: 1000 mL via INTRAVENOUS

## 2016-03-09 NOTE — ED Triage Notes (Signed)
Pt was seen last Saturday for chest pain, pt states that she cont to have pain on the left side of her chest and radiates across to her right side and states that she is also having some abd pain, pt states that when she woke up this morning her side was hurting so bad it caused her to yell. Pt states that she did a load of laundry last Saturday prior to the start of the pain but was wearing her back brace

## 2016-03-09 NOTE — ED Provider Notes (Signed)
[facility] Emergency Department Provider Note  ____________________________________________   I have reviewed the triage vital signs and the nursing notes.   HISTORY  Chief Complaint Back Pain and Chest Pain    HPI Erica Perry is a 82 y.o. female  Who has had chest wall pain for the last month or longer.  She has not fallen. Denies any hx of pe or sob.  She states usually it controlled with Tylenol. However this morning she changed position rapidly and the pain got worse.Patient has had no fever or chills no cough, she denies shortness of breath or pleuritic chest pain. She does have a history of chronic thoracic or lumbar compression fractures however she's had no recent fall. The lightning like pain that goes around usually the left from the back towards the front sometimes it is on the right.  It is positional. As long she does not move it does not hurt when she changes position the runway it does. She has had no numbness or weakness or incontinence of bowel or bladder. She has a history of "gas" and she wonders if that might be controlled Bitting. Patient was seen here and in minor care multiple times for the same complaint. She had a CT scan of her abdomen and pelvis on the 23rd of this month for this pain. At that time and the CT scan showed no acute pathology. She states that this moment she is not having much discomfort but if she changes position the runway she will "yell".   Past Medical History:  Diagnosis Date  . Compression fracture of thoracic vertebra (HCC)   . COPD (chronic obstructive pulmonary disease) (HCC)   . Depression   . Dysuria   . Female bladder prolapse   . Glaucoma   . Hyperlipidemia   . Hypertension   . Hypothyroidism   . IBS (irritable bowel syndrome)   . Incomplete emptying of bladder   . Urethral prolapse     Patient Active Problem List   Diagnosis Date Noted  . Compression fracture 08/11/2014  . Difficult or painful  urination 03/24/2014  . Abnormal finding on GI tract imaging 03/15/2014  . Abdominal pain, epigastric 03/15/2014  . CAFL (chronic airflow limitation) (HCC) 12/28/2013  . HLD (hyperlipidemia) 12/28/2013  . Adult hypothyroidism 12/28/2013  . Adaptive colitis 12/28/2013  . Chronic obstructive pulmonary disease (HCC) 12/28/2013  . Chronic constipation 10/19/2013    Past Surgical History:  Procedure Laterality Date  . SHOULDER SURGERY      Prior to Admission medications   Medication Sig Start Date End Date Taking? Authorizing Provider  aspirin 81 MG tablet Take 81 mg by mouth daily.   Yes Historical Provider, MD  bisacodyl (DULCOLAX) 5 MG EC tablet Take 1 tablet (5 mg total) by mouth daily as needed for moderate constipation. 03/02/16 03/02/17 Yes Willy Eddy, MD  dorzolamide-timolol (COSOPT) 22.3-6.8 MG/ML ophthalmic solution 1 drop 2 (two) times daily.   Yes Historical Provider, MD  fluticasone (FLONASE) 50 MCG/ACT nasal spray Place into both nostrils daily.   Yes Historical Provider, MD  levothyroxine (SYNTHROID, LEVOTHROID) 75 MCG tablet Take 75 mcg by mouth daily before breakfast.   Yes Historical Provider, MD  LORazepam (ATIVAN) 0.5 MG tablet Take 1 tablet by mouth 2 (two) times daily. 01/04/16  Yes Historical Provider, MD  omeprazole (PRILOSEC) 20 MG capsule Take 20 mg by mouth daily.   Yes Historical Provider, MD  Polyethylene Glycol 3350 (MIRALAX PO) Take by mouth.  Yes Historical Provider, MD  simvastatin (ZOCOR) 40 MG tablet Take 40 mg by mouth daily.   Yes Historical Provider, MD    Allergies Meperidine and related; Midazolam hcl; Sulfa antibiotics; Aleve [naproxen sodium]; and Codeine sulfate  Family History  Problem Relation Age of Onset  . Breast cancer Other 42  . Prostate cancer Neg Hx   . Kidney cancer Neg Hx   . Bladder Cancer Neg Hx     Social History Social History  Substance Use Topics  . Smoking status: Former Smoker    Packs/day: 0.50    Years:  30.00    Types: Cigarettes    Quit date: 08/09/2013  . Smokeless tobacco: Not on file  . Alcohol use No    Review of Systems }Constitutional: No fever/chills Eyes: No visual changes. ENT: No sore throat. No stiff neck no neck pain Cardiovascular: See history of present illness. Respiratory: Denies shortness of breath. Gastrointestinal:   no vomiting.  No diarrhea.  No constipation. Genitourinary: Negative for dysuria. Musculoskeletal: Negative lower extremity swelling Skin: Negative for rash. Neurological: Negative for severe headaches, focal weakness or numbness. 10-point ROS otherwise negative.  ____________________________________________   PHYSICAL EXAM:  VITAL SIGNS: ED Triage Vitals  Enc Vitals Group     BP 03/09/16 0905 (!) 166/94     Pulse Rate 03/09/16 0905 81     Resp 03/09/16 0905 18     Temp 03/09/16 0905 97.8 F (36.6 C)     Temp Source 03/09/16 0905 Oral     SpO2 03/09/16 0905 95 %     Weight 03/09/16 0906 128 lb (58.1 kg)     Height 03/09/16 0906 5\' 4"  (1.626 m)     Head Circumference --      Peak Flow --      Pain Score 03/09/16 0906 8     Pain Loc --      Pain Edu? --      Excl. in GC? --     Constitutional: Alert and oriented. Well appearing and in no acute distress. Eyes: Conjunctivae are normal. PERRL. EOMI. Head: Atraumatic. Nose: No congestion/rhinnorhea. Mouth/Throat: Mucous membranes are moist.  Oropharynx non-erythematous. Neck: No stridor.   Nontender with no meningismus Cardiovascular: Normal rate, regular rhythm. Grossly normal heart sounds.  Good peripheral circulation. Respiratory: Normal respiratory effort.  No retractions. Lungs CTAB. Abdominal: Soft and nontender. No distention. No guarding no rebound Back:  She does have us just to the left and to the right of midline in the mid thoracic region which reproduces her pain. There is no shingles lesions noted. There is no significant midline tenderness. There is no cellulitic  changes.  there are no lesions noted. there is no CVA tenderness  Musculoskeletal: No lower extremity tenderness, no upper extremity tenderness. No joint effusions, no DVT signs strong distal pulses no edema Neurologic:  Normal speech and language. No gross focal neurologic deficits are appreciated.  Skin:  Skin is warm, dry and intact. No rash noted. Psychiatric: Mood and affect are normal. Speech and behavior are normal.  ____________________________________________   LABS (all labs ordered are listed, but only abnormal results are displayed)  Labs Reviewed  BASIC METABOLIC PANEL - Abnormal; Notable for the following:       Result Value   Sodium 127 (*)    Chloride 91 (*)    Glucose, Bld 104 (*)    BUN <5 (*)    Calcium 8.5 (*)    All other components within normal  limits  CBC  TROPONIN I   ____________________________________________  EKG  I personally interpreted any EKGs ordered by me or triage Normal sinus rhythm at 85 bpm no acute ST elevation or acute ST depression, nonspecific ST changes noted. ____________________________________________  RADIOLOGY  I reviewed any imaging ordered by me or triage that were performed during my shift and, if possible, patient and/or family made aware of any abnormal findings. ____________________________________________   PROCEDURES  Procedure(s) performed: None  Procedures  Critical Care performed: None  ____________________________________________   INITIAL IMPRESSION / ASSESSMENT AND PLAN / ED COURSE  Pertinent labs & imaging results that were available during my care of the patient were reviewed by me and considered in my medical decision making (see chart for details).  Patient with very reproducible and positional pain for the last month. My concern is that this represents thoracolumbar radiculopathy. Patient may benefit from kyphoplasty, we will refer her to Dr. Rosita Kea. Her sodium is slightly low but I do not think  that is dangerous. I do not think the patient is in any evidence of acute cardiopulmonary pathology today. Specifically, she has no evidence of DVT or PE with this very reproducible positional pain nor is there any evidence of acute coronary syndrome with negative troponin despite a month of discomfort. The pain is nonexertional. It is positional. Her abdomen is benign and she had a negative CT scan for this pain less than a week ago. Extensive return precautions and follow-up given and understood.  Clinical Course    ____________________________________________   FINAL CLINICAL IMPRESSION(S) / ED DIAGNOSES  Final diagnoses:  None      This chart was dictated using voice recognition software.  Despite best efforts to proofread,  errors can occur which can change meaning.      Jeanmarie Plant, MD 03/09/16 [phone]

## 2016-03-09 NOTE — ED Notes (Signed)
Fluids still infusing - made family aware that the d/c papers are ready and to call when fluids done

## 2016-03-09 NOTE — ED Notes (Signed)
States pain is mostly when she lies down at night to sleep. Denies that it is chest pain. States side pain.

## 2016-03-20 ENCOUNTER — Other Ambulatory Visit: Payer: Self-pay | Admitting: Internal Medicine

## 2016-03-20 DIAGNOSIS — M546 Pain in thoracic spine: Principal | ICD-10-CM

## 2016-03-20 DIAGNOSIS — G8929 Other chronic pain: Secondary | ICD-10-CM

## 2016-04-01 ENCOUNTER — Ambulatory Visit: Payer: Medicare Other

## 2017-07-22 ENCOUNTER — Other Ambulatory Visit: Payer: Self-pay | Admitting: Gastroenterology

## 2017-07-22 DIAGNOSIS — R1084 Generalized abdominal pain: Secondary | ICD-10-CM

## 2017-07-28 ENCOUNTER — Ambulatory Visit
Admission: RE | Admit: 2017-07-28 | Discharge: 2017-07-28 | Disposition: A | Discharge status: Home / Self Care | Payer: Medicare Other | Source: Ambulatory Visit | Attending: Gastroenterology | Admitting: Gastroenterology

## 2017-07-28 DIAGNOSIS — R634 Abnormal weight loss: Secondary | ICD-10-CM | POA: Insufficient documentation

## 2017-07-28 DIAGNOSIS — R1084 Generalized abdominal pain: Secondary | ICD-10-CM | POA: Insufficient documentation

## 2017-07-28 DIAGNOSIS — N281 Cyst of kidney, acquired: Secondary | ICD-10-CM | POA: Insufficient documentation

## 2017-07-28 LAB — POCT I-STAT CREATININE: Creatinine, Ser: 0.6 mg/dL (ref 0.44–1.00)

## 2017-07-28 MED ORDER — IOPAMIDOL (ISOVUE-300) INJECTION 61%
75.0000 mL | Freq: Once | INTRAVENOUS | Status: AC | PRN
  Administered 2017-07-28: 75 mL via INTRAVENOUS

## 2018-08-12 ENCOUNTER — Emergency Department: Payer: Medicare Other

## 2018-08-12 ENCOUNTER — Emergency Department
Admission: EM | Admit: 2018-08-12 | Discharge: 2018-08-12 | Disposition: A | Discharge status: Home / Self Care | Payer: Medicare Other | Attending: Emergency Medicine | Admitting: Emergency Medicine

## 2018-08-12 ENCOUNTER — Encounter: Payer: Self-pay | Admitting: Emergency Medicine

## 2018-08-12 ENCOUNTER — Other Ambulatory Visit: Payer: Self-pay

## 2018-08-12 DIAGNOSIS — E039 Hypothyroidism, unspecified: Secondary | ICD-10-CM | POA: Insufficient documentation

## 2018-08-12 DIAGNOSIS — S60414A Abrasion of right ring finger, initial encounter: Secondary | ICD-10-CM | POA: Diagnosis not present

## 2018-08-12 DIAGNOSIS — I10 Essential (primary) hypertension: Secondary | ICD-10-CM | POA: Diagnosis not present

## 2018-08-12 DIAGNOSIS — S3992XA Unspecified injury of lower back, initial encounter: Secondary | ICD-10-CM | POA: Diagnosis present

## 2018-08-12 DIAGNOSIS — Y929 Unspecified place or not applicable: Secondary | ICD-10-CM | POA: Insufficient documentation

## 2018-08-12 DIAGNOSIS — Z7901 Long term (current) use of anticoagulants: Secondary | ICD-10-CM | POA: Insufficient documentation

## 2018-08-12 DIAGNOSIS — Z7982 Long term (current) use of aspirin: Secondary | ICD-10-CM | POA: Diagnosis not present

## 2018-08-12 DIAGNOSIS — K644 Residual hemorrhoidal skin tags: Secondary | ICD-10-CM

## 2018-08-12 DIAGNOSIS — S32030A Wedge compression fracture of third lumbar vertebra, initial encounter for closed fracture: Secondary | ICD-10-CM

## 2018-08-12 DIAGNOSIS — Y939 Activity, unspecified: Secondary | ICD-10-CM | POA: Insufficient documentation

## 2018-08-12 DIAGNOSIS — X58XXXA Exposure to other specified factors, initial encounter: Secondary | ICD-10-CM | POA: Diagnosis not present

## 2018-08-12 DIAGNOSIS — Y999 Unspecified external cause status: Secondary | ICD-10-CM | POA: Diagnosis not present

## 2018-08-12 DIAGNOSIS — Z79899 Other long term (current) drug therapy: Secondary | ICD-10-CM | POA: Insufficient documentation

## 2018-08-12 DIAGNOSIS — J449 Chronic obstructive pulmonary disease, unspecified: Secondary | ICD-10-CM | POA: Diagnosis not present

## 2018-08-12 LAB — URINALYSIS, COMPLETE (UACMP) WITH MICROSCOPIC
Bacteria, UA: NONE SEEN
Bilirubin Urine: NEGATIVE
Glucose, UA: NEGATIVE mg/dL
Hgb urine dipstick: NEGATIVE
Ketones, ur: NEGATIVE mg/dL
Leukocytes,Ua: NEGATIVE
Nitrite: NEGATIVE
Protein, ur: NEGATIVE mg/dL
Specific Gravity, Urine: 1.005 (ref 1.005–1.030)
Squamous Epithelial / LPF: NONE SEEN (ref 0–5)
pH: 7 (ref 5.0–8.0)

## 2018-08-12 MED ORDER — TRAMADOL HCL 50 MG PO TABS
25.0000 mg | ORAL_TABLET | Freq: Once | ORAL | Status: AC
  Administered 2018-08-12: 25 mg via ORAL
  Filled 2018-08-12: qty 1

## 2018-08-12 MED ORDER — POLYETHYLENE GLYCOL 3350 17 GM/SCOOP PO POWD: ORAL | 0 refills | Status: AC

## 2018-08-12 MED ORDER — OXYCODONE HCL 5 MG/5ML PO SOLN: 2.0000 mg | ORAL | 0 refills | Status: DC | PRN

## 2018-08-12 MED ORDER — CEPHALEXIN 500 MG PO CAPS: 500.0000 mg | ORAL_CAPSULE | Freq: Two times a day (BID) | ORAL | 0 refills | Status: DC

## 2018-08-12 NOTE — ED Notes (Signed)
Back from x-ray  Trying to obtain urine

## 2018-08-12 NOTE — Discharge Instructions (Signed)
Call make an appointment with your primary care provider for further discussion about your compression fracture.  You may continue taking Tylenol as you have been doing.  Also the Roxicodone is for moderate to severe pain.  Be aware that this medication could cause drowsiness and increase your risk for falling.  You may also use your back brace that you have used in the past for your previous compression fracture.  The antibiotic is twice a day for the next 7 days for your finger continue to keep it clean and dry.  The MiraLAX that was sent to the pharmacy is the medication that we discussed to add to your coffee in the morning to prevent constipation especially while you are on pain medication.  Return to the emergency room if any worsening of your symptoms or urgent concerns.

## 2018-08-12 NOTE — ED Notes (Signed)
See triage note   Presents with a 3-4 week hx of right lower back pain    States she has not fallen   Pain is mainly near flank area  Was seen and dx'd with muscle spasm and placed on prednisone   States pain has cont's

## 2018-08-12 NOTE — ED Provider Notes (Signed)
[facility] Emergency Department Provider Note   ____________________________________________   First MD Initiated Contact with Patient 08/12/18 [phone]     (approximate)  I have reviewed the triage vital signs and the nursing notes.   HISTORY  Chief Complaint Back Pain   HPI Erica Perry is a 85 y.o. female presents to the ED with complaint of low back pain for approximately 4 weeks.  Patient states there is no history of injury.  She was seen at an urgent care 4 weeks ago at which time she was given a prescription for prednisone.  She is quite upset that she did not have her back x-ray and states they would not says there was no history of injury.  Patient states that she is continued to have pain and has been taking Tylenol and using heat and ice to control her pain.  She denies any urinary symptoms.  She reports that she had a compression fracture "years ago" lower in her back.  Patient has continued to walk and do light housework.  She denies any saddle anesthesias or incontinence of bowel bladder.  She also is requesting exam to evaluate possible hemorrhoid.  She denies any blood noted in the toilet or on tissue.  Currently she rates her pain as a 7/10.     Past Medical History:  Diagnosis Date   Compression fracture of thoracic vertebra (HCC)    COPD (chronic obstructive pulmonary disease) (HCC)    Depression    Dysuria    Female bladder prolapse    Glaucoma    Hyperlipidemia    Hypertension    Hypothyroidism    IBS (irritable bowel syndrome)    Incomplete emptying of bladder    Urethral prolapse     Patient Active Problem List   Diagnosis Date Noted   Compression fracture 08/11/2014   Difficult or painful urination 03/24/2014   Abnormal finding on GI tract imaging 03/15/2014   Abdominal pain, epigastric 03/15/2014   CAFL (chronic airflow limitation) (HCC) 12/28/2013   HLD (hyperlipidemia) 12/28/2013   Adult  hypothyroidism 12/28/2013   Adaptive colitis 12/28/2013   Chronic obstructive pulmonary disease (HCC) 12/28/2013   Chronic constipation 10/19/2013    Past Surgical History:  Procedure Laterality Date   SHOULDER SURGERY      Prior to Admission medications   Medication Sig Start Date End Date Taking? Authorizing Provider  aspirin 81 MG tablet Take 81 mg by mouth daily.    [provider]  cephALEXin (KEFLEX) 500 MG capsule Take 1 capsule (500 mg total) by mouth 2 (two) times daily. 08/12/18   Tommi Rumps, PA-C  dorzolamide-timolol (COSOPT) 22.3-6.8 MG/ML ophthalmic solution 1 drop 2 (two) times daily.    [provider]  fluticasone (FLONASE) 50 MCG/ACT nasal spray Place into both nostrils daily.    [provider]  levothyroxine (SYNTHROID, LEVOTHROID) 75 MCG tablet Take 75 mcg by mouth daily before breakfast.    [provider]  LORazepam (ATIVAN) 0.5 MG tablet Take 1 tablet by mouth 2 (two) times daily. 01/04/16   [provider]  omeprazole (PRILOSEC) 20 MG capsule Take 20 mg by mouth daily.    [provider]  oxyCODONE (ROXICODONE) 5 MG/5ML solution Take 2 mLs (2 mg total) by mouth every 4 (four) hours as needed for severe pain. 08/12/18   Tommi Rumps, PA-C  polyethylene glycol powder (GLYCOLAX/MIRALAX) 17 GM/SCOOP powder 1/2 -1 capful once a day with water to prevent constipation.  May put powder in coffee or water 08/12/18   Tommi RumpsSummers, Munachimso Rigdon L, PA-C  simvastatin (ZOCOR) 40 MG tablet Take 40 mg by mouth daily.    [provider]    Allergies Meperidine and related; Midazolam hcl; Sulfa antibiotics; Aleve [naproxen sodium]; and Codeine sulfate  Family History  Problem Relation Age of Onset   Breast cancer Other 42   Prostate cancer Neg Hx    Kidney cancer Neg Hx    Bladder Cancer Neg Hx     Social History Social History   Tobacco Use   Smoking status: Former Smoker    Packs/day: 0.50    Years:  30.00    Pack years: 15.00    Types: Cigarettes    Last attempt to quit: 08/09/2013    Years since quitting: 5.0  Substance Use Topics   Alcohol use: No    Alcohol/week: 0.0 standard drinks   Drug use: Not on file    Review of Systems Constitutional: No fever/chills Cardiovascular: Denies chest pain. Respiratory: Denies shortness of breath. Gastrointestinal: No abdominal pain.  No nausea, no vomiting.  No diarrhea.  No constipation.  Positive concern for external hemorrhoids. Genitourinary: Negative for dysuria. Musculoskeletal: Positive for low back pain. Skin: Negative for rash. Neurological: Negative for headaches, focal weakness or numbness. ___________________________________________   PHYSICAL EXAM:  VITAL SIGNS: ED Triage Vitals  Enc Vitals Group     BP 08/12/18 0924 (!) 162/97     Pulse Rate 08/12/18 0924 84     Resp 08/12/18 0924 18     Temp 08/12/18 0924 (!) 97.5 F (36.4 C)     Temp Source 08/12/18 0924 Oral     SpO2 08/12/18 0924 (!) 89 %     Weight 08/12/18 0924 105 lb (47.6 kg)     Height 08/12/18 0924 5\' 4"  (1.626 m)     Head Circumference --      Peak Flow --      Pain Score 08/12/18 0918 7     Pain Loc --      Pain Edu? --      Excl. in GC? --    Constitutional: Alert and oriented. Well appearing and in no acute distress.  Patient is ambulatory without any assistance and is noted in the hallway to be walking without a limp. Eyes: Conjunctivae are normal.  Head: Atraumatic. Neck: No stridor.   Cardiovascular: Normal rate, regular rhythm. Grossly normal heart sounds.  Good peripheral circulation. Respiratory: Normal respiratory effort.  No retractions. Lungs CTAB. Gastrointestinal: Soft and nontender. No distention.  Bowel sounds normoactive x4 quadrants.  No abdominal bruits. No CVA tenderness.  Rectal exam shows a nonthrombosed single external hemorrhoid that is nontender to palpation.  No evidence of blood is present. Musculoskeletal: On exam there  is no gross deformity however there is point tenderness on palpation of the upper lumbar spine and paraspinous muscles.  Range of motion is guarded secondary to discomfort prefer sitting against the seat in the room which helps with her pain.  There is no decreased range of motion to lower extremities and she has good muscle strength.  No ecchymosis or abrasions to suggest direct trauma. Neurologic:  Normal speech and language. No gross focal neurologic deficits are appreciated. No gait instability. Skin:  Skin is warm, dry and intact. No rash noted.  Right index finger also is erythematous and patient has healing abrasions with pinpoint areas that possibly looks like purulent material. Psychiatric: Mood and affect are normal. Speech  and behavior are normal.  ____________________________________________   LABS (all labs ordered are listed, but only abnormal results are displayed)  Labs Reviewed  URINALYSIS, COMPLETE (UACMP) WITH MICROSCOPIC - Abnormal; Notable for the following components:      Result Value   Color, Urine YELLOW (*)    APPearance CLEAR (*)    All other components within normal limits    RADIOLOGY  ED MD interpretation:  Lumbar spine shows compression fracture L3 and degenerative changes  Official radiology report(s): Dg Lumbar Spine 2-3 Views  Result Date: 08/12/2018 CLINICAL DATA:  85 year old female with 4 weeks of back pain following increased activity at home. EXAM: LUMBAR SPINE - 2-3 VIEW COMPARISON:  CT Abdomen and Pelvis 07/28/2017 and earlier. FINDINGS: Chronic osteopenia and widespread lower thoracic and lumbar compression fractures. Lumbar bone detail is limited. It appears that there is new compression of the L3 superior endplate from 1 year ago. Grossly stable sacrum. Stable lumbar vertebral alignment. Negative visible bowel gas pattern. Aortoiliac calcified atherosclerosis. IMPRESSION: 1. Chronic osteopenia and widespread compression fractures. Possible recent  L3 superior endplate compression fracture. If specific therapy such as vertebroplasty is desired, noncontrast Lumbar MRI or whole-body bone scan would best determine acuity. 2.  Aortic Atherosclerosis (ICD10-I70.0). Electronically Signed   By: Odessa Fleming M.D.   On: 08/12/2018 10:45   Ct Lumbar Spine Wo Contrast  Result Date: 08/12/2018 CLINICAL DATA:  Low back pain for 3-4 days.  No known injury. EXAM: CT LUMBAR SPINE WITHOUT CONTRAST TECHNIQUE: Multidetector CT imaging of the lumbar spine was performed without intravenous contrast administration. Multiplanar CT image reconstructions were also generated. COMPARISON:  CT abdomen and pelvis 07/28/2017. FINDINGS: Segmentation: Standard. Alignment: Mild convex left scoliosis with the apex at L2-3. Vertebrae: Remote L1, L2 and L4 compression fractures are identified. The patient has a mild superior endplate compression fracture of L3 which is new since the prior CT scan and has sharp fracture margins compatible with acute or subacute injury. Vertebral body height loss is estimated at up to 20% centrally. No bony retropulsion or involvement of the posterior elements. Bones are osteopenic. No focal lesion. Paraspinal and other soft tissues: Atherosclerosis noted. Disc levels: T12-L1: Mild bony retropulsion off the superior endplate. Mild central canal stenosis is present. Foramina are open. L1-2: Negative. L2-3: Shallow disc bulge and endplate spur to the right. The central canal and foramina are open. L3-4: Mild bony retropulsion off the superior endplate and ligamentum flavum thickening. There is some facet degenerative change. The central canal and foramina are open. L4-5: Facet arthropathy, ligamentum flavum thickening and a shallow disc bulge with endplate spur. There is moderate central canal narrowing. The foramina are open. L5-S1: Left worse than right facet degenerative disease. There is a shallow disc bulge and ligamentum flavum thickening. Mild central canal and  bilateral subarticular recess narrowing. IMPRESSION: Superior endplate compression fracture of L3 with vertebral body height loss of up to 20% appears acute or subacute. There is no bony retropulsion or involvement of the posterior elements. Remote L1, L2 and L4 compression fractures. Osteopenia. Spondylosis as detailed above appears most notable at L4-5. Atherosclerosis. Electronically Signed   By: Drusilla Kanner M.D.   On: 08/12/2018 11:58    ____________________________________________   PROCEDURES  Procedure(s) performed (including Critical Care):  Procedures   ____________________________________________   INITIAL IMPRESSION / ASSESSMENT AND PLAN / ED COURSE Review of previous ED notes and [employer].  85 year old female presents to the ED with complaint of low back pain  without history of injury.  Patient has had pain for the last 4 weeks.  Initially she was evaluated at an urgent care and placed on prednisone but no images was done.  She has continued to do light housework such as mopping the floor and doing laundry while wearing her back brace that she has had for previous compression fractures.  Patient is ambulatory without any assistance.  X-ray is suggestive of an L3 compression fracture and CT scan confirms this to be most likely a new fracture.  Patient also has several other old compression fractures present.  Patient was made aware.  Patient was placed on Keflex 500 mg twice daily for 7 days for her finger and she is instructed to clean daily with mild soap and water and watch for any continued infection.  She was also given a prescription for Roxicodone 5 mg per 5 mL's.  Patient is to take 2 mg every 6 hours as needed for pain.  She understands that this medication may make her drowsy and increase her risk for falling.  She was also given a prescription for MiraLAX 1/2-1 capful for prevention of constipation while taking the narcotic.  She is to follow-up with  her PCP.  She is aware that she may return to the emergency department if any severe worsening of her symptoms or urgent concerns. ____________________________________________   FINAL CLINICAL IMPRESSION(S) / ED DIAGNOSES  Final diagnoses:  Compression fracture of L3 lumbar vertebra, closed, initial encounter (HCC)  Abrasion of right ring finger, initial encounter  External hemorrhoids     ED Discharge Orders         Ordered    oxyCODONE (ROXICODONE) 5 MG/5ML solution  Every 4 hours PRN     08/12/18 1227    polyethylene glycol powder (GLYCOLAX/MIRALAX) 17 GM/SCOOP powder     08/12/18 1227    cephALEXin (KEFLEX) 500 MG capsule  2 times daily     08/12/18 1228           Note:  This document was prepared using Dragon voice recognition software and may include unintentional dictation errors.    Tommi Rumps, PA-C 08/12/18 1547    Shaune Pollack, MD 08/12/18 2137

## 2018-08-12 NOTE — ED Notes (Signed)
Derrick Ravel [phone] states he is patient's friend, will come and get patient when she is done.

## 2018-08-12 NOTE — ED Triage Notes (Signed)
Pt presents to ED via POV with c/o back pain x 3-4. Pt states was seen at Urgent Care and was told it was muscle spasms and given prednisone without relief. Pt ambulatory to the lobby refusing a wheelchair. Pt states worse with movement at this time. Pt states pain relieved with laying down.

## 2018-11-19 ENCOUNTER — Encounter: Payer: Self-pay | Admitting: Emergency Medicine

## 2018-11-19 ENCOUNTER — Other Ambulatory Visit: Payer: Self-pay

## 2018-11-19 ENCOUNTER — Emergency Department: Payer: Medicare Other

## 2018-11-19 ENCOUNTER — Inpatient Hospital Stay
Admission: EM | Admit: 2018-11-19 | Discharge: 2018-11-21 | DRG: 281 | Disposition: A | Discharge status: Home / Self Care | Payer: Medicare Other | Attending: Internal Medicine | Admitting: Internal Medicine

## 2018-11-19 DIAGNOSIS — E871 Hypo-osmolality and hyponatremia: Secondary | ICD-10-CM | POA: Diagnosis not present

## 2018-11-19 DIAGNOSIS — F329 Major depressive disorder, single episode, unspecified: Secondary | ICD-10-CM | POA: Diagnosis present

## 2018-11-19 DIAGNOSIS — I420 Dilated cardiomyopathy: Secondary | ICD-10-CM | POA: Diagnosis present

## 2018-11-19 DIAGNOSIS — Z23 Encounter for immunization: Secondary | ICD-10-CM | POA: Diagnosis present

## 2018-11-19 DIAGNOSIS — G319 Degenerative disease of nervous system, unspecified: Secondary | ICD-10-CM

## 2018-11-19 DIAGNOSIS — E860 Dehydration: Secondary | ICD-10-CM | POA: Diagnosis present

## 2018-11-19 DIAGNOSIS — H409 Unspecified glaucoma: Secondary | ICD-10-CM | POA: Diagnosis present

## 2018-11-19 DIAGNOSIS — J449 Chronic obstructive pulmonary disease, unspecified: Secondary | ICD-10-CM | POA: Diagnosis present

## 2018-11-19 DIAGNOSIS — Z888 Allergy status to other drugs, medicaments and biological substances status: Secondary | ICD-10-CM

## 2018-11-19 DIAGNOSIS — I10 Essential (primary) hypertension: Secondary | ICD-10-CM | POA: Diagnosis present

## 2018-11-19 DIAGNOSIS — J432 Centrilobular emphysema: Secondary | ICD-10-CM | POA: Diagnosis not present

## 2018-11-19 DIAGNOSIS — Z66 Do not resuscitate: Secondary | ICD-10-CM | POA: Diagnosis present

## 2018-11-19 DIAGNOSIS — I249 Acute ischemic heart disease, unspecified: Secondary | ICD-10-CM | POA: Diagnosis not present

## 2018-11-19 DIAGNOSIS — E785 Hyperlipidemia, unspecified: Secondary | ICD-10-CM | POA: Diagnosis present

## 2018-11-19 DIAGNOSIS — K589 Irritable bowel syndrome without diarrhea: Secondary | ICD-10-CM | POA: Diagnosis present

## 2018-11-19 DIAGNOSIS — E876 Hypokalemia: Secondary | ICD-10-CM | POA: Diagnosis present

## 2018-11-19 DIAGNOSIS — Z79890 Hormone replacement therapy (postmenopausal): Secondary | ICD-10-CM | POA: Diagnosis not present

## 2018-11-19 DIAGNOSIS — I340 Nonrheumatic mitral (valve) insufficiency: Secondary | ICD-10-CM | POA: Diagnosis not present

## 2018-11-19 DIAGNOSIS — R0602 Shortness of breath: Secondary | ICD-10-CM | POA: Diagnosis not present

## 2018-11-19 DIAGNOSIS — Z20828 Contact with and (suspected) exposure to other viral communicable diseases: Secondary | ICD-10-CM | POA: Diagnosis present

## 2018-11-19 DIAGNOSIS — I739 Peripheral vascular disease, unspecified: Secondary | ICD-10-CM | POA: Diagnosis present

## 2018-11-19 DIAGNOSIS — I214 Non-ST elevation (NSTEMI) myocardial infarction: Secondary | ICD-10-CM | POA: Diagnosis not present

## 2018-11-19 DIAGNOSIS — I361 Nonrheumatic tricuspid (valve) insufficiency: Secondary | ICD-10-CM | POA: Diagnosis not present

## 2018-11-19 DIAGNOSIS — Z7982 Long term (current) use of aspirin: Secondary | ICD-10-CM

## 2018-11-19 DIAGNOSIS — M1611 Unilateral primary osteoarthritis, right hip: Secondary | ICD-10-CM | POA: Diagnosis present

## 2018-11-19 DIAGNOSIS — Z79891 Long term (current) use of opiate analgesic: Secondary | ICD-10-CM

## 2018-11-19 DIAGNOSIS — Z882 Allergy status to sulfonamides status: Secondary | ICD-10-CM

## 2018-11-19 DIAGNOSIS — Z87891 Personal history of nicotine dependence: Secondary | ICD-10-CM | POA: Diagnosis not present

## 2018-11-19 DIAGNOSIS — Z79899 Other long term (current) drug therapy: Secondary | ICD-10-CM | POA: Diagnosis not present

## 2018-11-19 DIAGNOSIS — Z803 Family history of malignant neoplasm of breast: Secondary | ICD-10-CM | POA: Diagnosis not present

## 2018-11-19 DIAGNOSIS — R410 Disorientation, unspecified: Secondary | ICD-10-CM

## 2018-11-19 DIAGNOSIS — E039 Hypothyroidism, unspecified: Secondary | ICD-10-CM | POA: Diagnosis present

## 2018-11-19 DIAGNOSIS — I429 Cardiomyopathy, unspecified: Secondary | ICD-10-CM

## 2018-11-19 LAB — GLUCOSE, CAPILLARY: Glucose-Capillary: 103 mg/dL — ABNORMAL HIGH (ref 70–99)

## 2018-11-19 LAB — DIFFERENTIAL
Abs Immature Granulocytes: 0.03 10*3/uL (ref 0.00–0.07)
Basophils Absolute: 0 10*3/uL (ref 0.0–0.1)
Basophils Relative: 0 %
Eosinophils Absolute: 0.1 10*3/uL (ref 0.0–0.5)
Eosinophils Relative: 1 %
Immature Granulocytes: 0 %
Lymphocytes Relative: 16 %
Lymphs Abs: 1.4 10*3/uL (ref 0.7–4.0)
Monocytes Absolute: 0.8 10*3/uL (ref 0.1–1.0)
Monocytes Relative: 10 %
Neutro Abs: 6.2 10*3/uL (ref 1.7–7.7)
Neutrophils Relative %: 73 %

## 2018-11-19 LAB — CBC
HCT: 37.8 % (ref 36.0–46.0)
Hemoglobin: 12.6 g/dL (ref 12.0–15.0)
MCH: 30.1 pg (ref 26.0–34.0)
MCHC: 33.3 g/dL (ref 30.0–36.0)
MCV: 90.2 fL (ref 80.0–100.0)
Platelets: 260 10*3/uL (ref 150–400)
RBC: 4.19 MIL/uL (ref 3.87–5.11)
RDW: 13.8 % (ref 11.5–15.5)
WBC: 8.6 10*3/uL (ref 4.0–10.5)
nRBC: 0 % (ref 0.0–0.2)

## 2018-11-19 LAB — COMPREHENSIVE METABOLIC PANEL
ALT: 16 U/L (ref 0–44)
AST: 57 U/L — ABNORMAL HIGH (ref 15–41)
Albumin: 3.9 g/dL (ref 3.5–5.0)
Alkaline Phosphatase: 84 U/L (ref 38–126)
Anion gap: 12 (ref 5–15)
BUN: 10 mg/dL (ref 8–23)
CO2: 25 mmol/L (ref 22–32)
Calcium: 8.9 mg/dL (ref 8.9–10.3)
Chloride: 97 mmol/L — ABNORMAL LOW (ref 98–111)
Creatinine, Ser: 0.5 mg/dL (ref 0.44–1.00)
GFR calc Af Amer: >60 mL/min (ref >60)
GFR calc non Af Amer: >60 mL/min (ref >60)
Glucose, Bld: 105 mg/dL — ABNORMAL HIGH (ref 70–99)
Potassium: 3.7 mmol/L (ref 3.5–5.1)
Sodium: 134 mmol/L — ABNORMAL LOW (ref 135–145)
Total Bilirubin: 0.9 mg/dL (ref 0.3–1.2)
Total Protein: 6.6 g/dL (ref 6.5–8.1)

## 2018-11-19 LAB — LIPID PANEL
Cholesterol: 138 mg/dL (ref 0–200)
HDL: 67 mg/dL (ref >40)
LDL Cholesterol: 64 mg/dL (ref 0–99)
Total CHOL/HDL Ratio: 2.1 RATIO
Triglycerides: 35 mg/dL (ref <150)
VLDL: 7 mg/dL (ref 0–40)

## 2018-11-19 LAB — TROPONIN I (HIGH SENSITIVITY)
Troponin I (High Sensitivity): 3372 ng/L (ref <18)
Troponin I (High Sensitivity): 3796 ng/L (ref <18)
Troponin I (High Sensitivity): 3101 ng/L (ref <18)
Troponin I (High Sensitivity): 3094 ng/L (ref <18)

## 2018-11-19 LAB — PROTIME-INR
INR: 1 (ref 0.8–1.2)
Prothrombin Time: 13.2 seconds (ref 11.4–15.2)

## 2018-11-19 LAB — APTT: aPTT: 29 seconds (ref 24–36)

## 2018-11-19 LAB — TSH: TSH: 4.399 u[IU]/mL (ref 0.350–4.500)

## 2018-11-19 LAB — SARS CORONAVIRUS 2 BY RT PCR (HOSPITAL ORDER, PERFORMED IN ~~LOC~~ HOSPITAL LAB): SARS Coronavirus 2: NEGATIVE

## 2018-11-19 LAB — HEPARIN LEVEL (UNFRACTIONATED): Heparin Unfractionated: 0.46 IU/mL (ref 0.30–0.70)

## 2018-11-19 MED ORDER — ASPIRIN 300 MG RE SUPP: 300.0000 mg | RECTAL | Status: DC

## 2018-11-19 MED ORDER — NITROGLYCERIN 0.4 MG SL SUBL: 0.4000 mg | SUBLINGUAL_TABLET | SUBLINGUAL | Status: DC | PRN

## 2018-11-19 MED ORDER — LEVOTHYROXINE SODIUM 50 MCG PO TABS
75.0000 ug | ORAL_TABLET | Freq: Every day | ORAL | Status: DC
  Administered 2018-11-20 – 2018-11-21 (×2): 75 ug via ORAL
  Filled 2018-11-19: qty 1
  Filled 2018-11-19: qty 3

## 2018-11-19 MED ORDER — ATORVASTATIN CALCIUM 20 MG PO TABS
40.0000 mg | ORAL_TABLET | Freq: Every day | ORAL | Status: DC
  Administered 2018-11-19 – 2018-11-20 (×2): 40 mg via ORAL
  Filled 2018-11-19 (×3): qty 2

## 2018-11-19 MED ORDER — PANTOPRAZOLE SODIUM 40 MG PO TBEC
40.0000 mg | DELAYED_RELEASE_TABLET | Freq: Every day | ORAL | Status: DC
  Administered 2018-11-20 – 2018-11-21 (×2): 40 mg via ORAL
  Filled 2018-11-19 (×3): qty 1

## 2018-11-19 MED ORDER — SODIUM CHLORIDE 0.9% FLUSH: 3.0000 mL | Freq: Once | INTRAVENOUS | Status: DC

## 2018-11-19 MED ORDER — ONDANSETRON HCL 4 MG/2ML IJ SOLN: 4.0000 mg | Freq: Four times a day (QID) | INTRAMUSCULAR | Status: DC | PRN

## 2018-11-19 MED ORDER — ACETAMINOPHEN 325 MG PO TABS
650.0000 mg | ORAL_TABLET | ORAL | Status: DC | PRN
  Administered 2018-11-20 – 2018-11-21 (×2): 650 mg via ORAL
  Filled 2018-11-19 (×3): qty 2

## 2018-11-19 MED ORDER — ASPIRIN 81 MG PO CHEW
324.0000 mg | CHEWABLE_TABLET | Freq: Once | ORAL | Status: AC
  Administered 2018-11-19: 324 mg via ORAL
  Filled 2018-11-19: qty 4

## 2018-11-19 MED ORDER — ASPIRIN 81 MG PO CHEW: 324.0000 mg | CHEWABLE_TABLET | ORAL | Status: DC

## 2018-11-19 MED ORDER — ZOLPIDEM TARTRATE 5 MG PO TABS: 5.0000 mg | ORAL_TABLET | Freq: Every evening | ORAL | Status: DC | PRN

## 2018-11-19 MED ORDER — ALBUTEROL SULFATE (2.5 MG/3ML) 0.083% IN NEBU: 2.5000 mg | INHALATION_SOLUTION | RESPIRATORY_TRACT | Status: DC | PRN

## 2018-11-19 MED ORDER — ACETAMINOPHEN 325 MG PO TABS
650.0000 mg | ORAL_TABLET | Freq: Four times a day (QID) | ORAL | Status: DC | PRN
  Administered 2018-11-19: 650 mg via ORAL

## 2018-11-19 MED ORDER — SODIUM CHLORIDE 0.9% FLUSH: 3.0000 mL | INTRAVENOUS | Status: DC | PRN

## 2018-11-19 MED ORDER — PNEUMOCOCCAL VAC POLYVALENT 25 MCG/0.5ML IJ INJ
0.5000 mL | INJECTION | INTRAMUSCULAR | Status: AC
  Administered 2018-11-20: 0.5 mL via INTRAMUSCULAR
  Filled 2018-11-19: qty 0.5

## 2018-11-19 MED ORDER — SENNA 8.6 MG PO TABS
1.0000 | ORAL_TABLET | Freq: Every day | ORAL | Status: DC | PRN
  Filled 2018-11-19: qty 1

## 2018-11-19 MED ORDER — HEPARIN BOLUS VIA INFUSION
2700.0000 [IU] | Freq: Once | INTRAVENOUS | Status: AC
  Administered 2018-11-19: 2700 [IU] via INTRAVENOUS
  Filled 2018-11-19: qty 2700

## 2018-11-19 MED ORDER — BISACODYL 5 MG PO TBEC
5.0000 mg | DELAYED_RELEASE_TABLET | Freq: Every day | ORAL | Status: DC | PRN
  Filled 2018-11-19: qty 1

## 2018-11-19 MED ORDER — SODIUM CHLORIDE 0.9 % IV SOLN: 250.0000 mL | INTRAVENOUS | Status: DC | PRN

## 2018-11-19 MED ORDER — PANTOPRAZOLE SODIUM 20 MG PO TBEC
20.0000 mg | DELAYED_RELEASE_TABLET | Freq: Every day | ORAL | Status: DC
  Administered 2018-11-19: 16:00:00 20 mg via ORAL
  Filled 2018-11-19: qty 1

## 2018-11-19 MED ORDER — TIMOLOL MALEATE 0.5 % OP SOLN
1.0000 [drp] | Freq: Every day | OPHTHALMIC | Status: DC
  Administered 2018-11-20 – 2018-11-21 (×2): 1 [drp] via OPHTHALMIC
  Filled 2018-11-19: qty 5

## 2018-11-19 MED ORDER — SODIUM CHLORIDE 0.9% FLUSH
3.0000 mL | Freq: Two times a day (BID) | INTRAVENOUS | Status: DC
  Administered 2018-11-19 – 2018-11-20 (×3): 3 mL via INTRAVENOUS

## 2018-11-19 MED ORDER — CARVEDILOL 6.25 MG PO TABS: 3.1250 mg | ORAL_TABLET | Freq: Two times a day (BID) | ORAL | Status: DC

## 2018-11-19 MED ORDER — CARVEDILOL 3.125 MG PO TABS
3.1250 mg | ORAL_TABLET | Freq: Two times a day (BID) | ORAL | Status: DC
  Administered 2018-11-19 – 2018-11-21 (×4): 3.125 mg via ORAL
  Filled 2018-11-19 (×5): qty 1

## 2018-11-19 MED ORDER — HEPARIN (PORCINE) 25000 UT/250ML-% IV SOLN
550.0000 [IU]/h | INTRAVENOUS | Status: DC
  Administered 2018-11-19: 11:00:00 550 [IU]/h via INTRAVENOUS
  Filled 2018-11-19: qty 250

## 2018-11-19 MED ORDER — METOPROLOL TARTRATE 25 MG PO TABS: 12.5000 mg | ORAL_TABLET | Freq: Two times a day (BID) | ORAL | Status: DC

## 2018-11-19 MED ORDER — ASPIRIN EC 81 MG PO TBEC
81.0000 mg | DELAYED_RELEASE_TABLET | Freq: Every day | ORAL | Status: DC
  Administered 2018-11-20 – 2018-11-21 (×2): 81 mg via ORAL
  Filled 2018-11-19 (×3): qty 1

## 2018-11-19 MED ORDER — LORAZEPAM 0.5 MG PO TABS
0.5000 mg | ORAL_TABLET | Freq: Two times a day (BID) | ORAL | Status: DC
  Administered 2018-11-19 – 2018-11-21 (×4): 0.5 mg via ORAL
  Filled 2018-11-19 (×5): qty 1

## 2018-11-19 NOTE — ED Notes (Signed)
Dr Gollan at bedside °

## 2018-11-19 NOTE — ED Provider Notes (Addendum)
[facility] Emergency Department Provider Note       Time seen: ----------------------------------------- 7:35 AM on 11/19/2018 -----------------------------------------   I have reviewed the triage vital signs and the nursing notes.  HISTORY   Chief Complaint Numbness and Altered Mental Status    HPI Erica Perry is a 85 y.o. female with a history of COPD, depression, hypertension, hyperlipidemia, IBS who presents to the ED for confusion since yesterday.  Patient has had numbness in her left arm since this morning at 330.  She was last known well last night.  Daughter states confusion resolved somewhat last night but is back worse today.  She arrives alert and oriented.  Past Medical History:  Diagnosis Date  . Compression fracture of thoracic vertebra (HCC)   . COPD (chronic obstructive pulmonary disease) (HCC)   . Depression   . Dysuria   . Female bladder prolapse   . Glaucoma   . Hyperlipidemia   . Hypertension   . Hypothyroidism   . IBS (irritable bowel syndrome)   . Incomplete emptying of bladder   . Urethral prolapse     Patient Active Problem List   Diagnosis Date Noted  . Compression fracture 08/11/2014  . Difficult or painful urination 03/24/2014  . Abnormal finding on GI tract imaging 03/15/2014  . Abdominal pain, epigastric 03/15/2014  . CAFL (chronic airflow limitation) (HCC) 12/28/2013  . HLD (hyperlipidemia) 12/28/2013  . Adult hypothyroidism 12/28/2013  . Adaptive colitis 12/28/2013  . Chronic obstructive pulmonary disease (HCC) 12/28/2013  . Chronic constipation 10/19/2013    Past Surgical History:  Procedure Laterality Date  . SHOULDER SURGERY      Allergies Meperidine and related, Midazolam hcl, Sulfa antibiotics, Aleve [naproxen sodium], and Codeine sulfate  Social History Social History   Tobacco Use  . Smoking status: Former Smoker    Packs/day: 0.50    Years: 30.00    Pack years: 15.00    Types:  Cigarettes    Quit date: 08/09/2013    Years since quitting: 5.2  . Smokeless tobacco: Never Used  Substance Use Topics  . Alcohol use: No    Alcohol/week: 0.0 standard drinks  . Drug use: Not on file   Review of Systems Constitutional: Negative for fever. Cardiovascular: Negative for chest pain. Respiratory: Negative for shortness of breath. Gastrointestinal: Negative for abdominal pain, vomiting and diarrhea. Musculoskeletal: Negative for back pain. Skin: Negative for rash. Neurological: Positive for confusion, left arm numbness  All systems negative/normal/unremarkable except as stated in the HPI  ____________________________________________   PHYSICAL EXAM:  VITAL SIGNS: ED Triage Vitals  Enc Vitals Group     BP 11/19/18 0721 (!) 149/87     Pulse Rate 11/19/18 0721 95     Resp 11/19/18 0721 18     Temp 11/19/18 0721 98.3 F (36.8 C)     Temp Source 11/19/18 0721 Oral     SpO2 11/19/18 0721 94 %     Weight 11/19/18 0722 102 lb (46.3 kg)     Height 11/19/18 0722 5' (1.524 m)     Head Circumference --      Peak Flow --      Pain Score 11/19/18 0722 0     Pain Loc --      Pain Edu? --      Excl. in GC? --    Constitutional: Alert and oriented. Well appearing and in no distress. Eyes: Conjunctivae are normal. Normal extraocular movements. ENT      Head:  Normocephalic and atraumatic.      Nose: No congestion/rhinnorhea.      Mouth/Throat: Mucous membranes are moist.      Neck: No stridor. Cardiovascular: Normal rate, regular rhythm. No murmurs, rubs, or gallops. Respiratory: Normal respiratory effort without tachypnea nor retractions. Breath sounds are clear and equal bilaterally. No wheezes/rales/rhonchi. Gastrointestinal: Soft and nontender. Normal bowel sounds Musculoskeletal: Nontender with normal range of motion in extremities. No lower extremity tenderness nor edema. Neurologic:  Normal speech and language. No gross focal neurologic deficits are appreciated.   Skin:  Skin is warm, dry and intact. No rash noted. Psychiatric: Mood and affect are normal. Speech and behavior are normal.  ____________________________________________  EKG: Interpreted by me.  Sinus rhythm with rate of 94 bpm, left axis deviation, LVH, possible septal infarct age-indeterminate, nonspecific T wave abnormalities, normal QT  ____________________________________________  ED COURSE:  As part of my medical decision making, I reviewed the following data within the [place] History obtained from family if available, nursing notes, old chart and ekg, as well as notes from prior ED visits. Patient presented for altered mental status and left arm numbness, we will assess with labs and imaging as indicated at this time.   Procedures  Lalana Wachter was evaluated in Emergency Department on 11/19/2018 for the symptoms described in the history of present illness. She was evaluated in the context of the global COVID-19 pandemic, which necessitated consideration that the patient might be at risk for infection with the SARS-CoV-2 virus that causes COVID-19. Institutional protocols and algorithms that pertain to the evaluation of patients at risk for COVID-19 are in a state of rapid change based on information released by regulatory bodies including the CDC and federal and state organizations. These policies and algorithms were followed during the patient's care in the ED.  ____________________________________________   LABS (pertinent positives/negatives)  Labs Reviewed  COMPREHENSIVE METABOLIC PANEL - Abnormal; Notable for the following components:      Result Value   Sodium 134 (*)    Chloride 97 (*)    Glucose, Bld 105 (*)    AST 57 (*)    All other components within normal limits  GLUCOSE, CAPILLARY - Abnormal; Notable for the following components:   Glucose-Capillary 103 (*)    All other components within normal limits  TROPONIN I (HIGH SENSITIVITY) -  Abnormal; Notable for the following components:   Troponin I (High Sensitivity) 3,372 (*)    All other components within normal limits  PROTIME-INR  APTT  CBC  DIFFERENTIAL  URINALYSIS, COMPLETE (UACMP) WITH MICROSCOPIC  CBG MONITORING, ED   CRITICAL CARE Performed by: Laurence Aly   Total critical care time: 30 minutes  Critical care time was exclusive of separately billable procedures and treating other patients.  Critical care was necessary to treat or prevent imminent or life-threatening deterioration.  Critical care was time spent personally by me on the following activities: development of treatment plan with patient and/or surrogate as well as nursing, discussions with consultants, evaluation of patient's response to treatment, examination of patient, obtaining history from patient or surrogate, ordering and performing treatments and interventions, ordering and review of laboratory studies, ordering and review of radiographic studies, pulse oximetry and re-evaluation of patient's condition.   RADIOLOGY Images were viewed by me  CT head IMPRESSION: 1. No acute abnormality. 2. Moderate to marked diffuse cerebral and cerebellar atrophy. 3. Moderate to marked chronic small vessel white matter ischemic changes in both cerebral hemispheres. 4.  Mild chronic right ethmoid sinusitis. ____________________________________________   DIFFERENTIAL DIAGNOSIS   CVA, TIA, occult infection, dehydration, electrolyte abnormality  FINAL ASSESSMENT AND PLAN  Confusion, paresthesia, non-STEMI   Plan: The patient had presented for confusion and left arm numbness. Patient's labs revealed a surprisingly elevated troponin with a level of 3300. Patient's imaging did not reveal any acute process.  Unclear etiology for her constellation of symptoms.  Given her elevated troponin she will need to be admitted with a more thorough cardiac work-up.  She does have new EKG changes compared to  2017.  She will be given aspirin and heparin.   Ulice DashJohnathan E Ebonye Reade, MD    Note: This note was generated in part or whole with voice recognition software. Voice recognition is usually quite accurate but there are transcription errors that can and very often do occur. I apologize for any typographical errors that were not detected and corrected.     Emily FilbertWilliams, Sunya Humbarger E, MD 11/19/18 1008    Emily FilbertWilliams, Sunday Klos E, MD 12/03/18 1145

## 2018-11-19 NOTE — ED Notes (Signed)
Date and time results received: 11/19/18 10:05  Test: Troponin Critical Value: 3372  Name of Provider Notified: Dr Jimmye Norman  No new orders at this time.

## 2018-11-19 NOTE — Progress Notes (Signed)
Advanced Care Plan.  Purpose of Encounter: CODE STATUS. Parties in Attendance: The patient and me. Patient's Decisional Capacity: Yes. Medical Story: Erica Perry  is a 85 y.o. female with a known history of COPD, hypertension, hyperlipidemia, hypothyroidism, urethral prolapse, compression fracture of thoracic vertebra, etc. The patient is being admitted for non-STEMI and heparin drip.  I discussed with patient about her current condition, prognosis and CODE STATUS.  The patient does not want to be resuscitated or intubated if she has cardiopulmonary arrest. Plan:  Code Status: DNR. Time spent discussing advance care planning: 18 minutes.

## 2018-11-19 NOTE — ED Triage Notes (Signed)
Pt via pov from home with daughter c/o confusion since yesterday, numbness in her left arm since this morning at 0330. Pt LKW at 2100 last night. Pt's daughter sd confusion resolved somewhat last night but that it is back and worse today. Pt alert, oriented x 4 at this time.

## 2018-11-19 NOTE — Consult Note (Signed)
Cardiology Consultation:   Patient ID: Erica Perry MRN: [id number]; DOB: Apr 24, 1924  Admit date: 11/19/2018 Date of Consult: 11/19/2018  Primary Care Provider: Baxter Hire, MD Primary Cardiologist: [facility], Dr. Rockey Situ Primary Electrophysiologist:  None    Patient Profile:   Erica Perry is a 85 y.o. female with a hx of HTN, HLD, COPD, previous history of smoking (quit ~ 2018), compression fracture of thoracic vertebrate, IBS, and hypothyroidism who is being seen today for the evaluation of elevated HS Tn at the request of [place].  History of Present Illness:   [name] is a 85 yo female with PMH as above and no previous cardiologist.  She is a previous smoker and has a history of HTN and HLD on statin medication. PTA medications include ASA with prilosec and simvastatin 40mg  daily.   On 11/19/2018, she presented to the ED with complaint of right finger pain and "funny feeling" since 3AM on 11/19/18. Per EMR, she was also confused and disoriented yesterday 11/18/2018. This confusion has reportedly been intermittent since yesterday. No previous or current reported chest pain. No SOB, DOE, nausea, emesis, diaphoresis, presyncope, syncope, racing HR, or palpitations. Her only other complaint was of right hip pain 2/2 arthritis.   At time of cardiac consultation in the ED, patient was oriented and alert. She reported that her left finger pain was gone at that time. No CP.  In the ED, HR elevated into the 90s with SBP into the 160s.  HS Tn elevated 3372  3796. EKG as below with NSR, 94 bpm, LAD, borderline IVCD/LAFB, T wave abnormality/ inversion noted in inferior lateral leads, cannot rule out previous septal infarct, aVL meets criteria for LVH. When compared with previous EKG, TWI is new.   Heart Pathway Score:     Past Medical History:  Diagnosis Date  . Compression fracture of thoracic vertebra (HCC)   . COPD (chronic obstructive pulmonary disease) ([place])   . Depression   .  Dysuria   . Female bladder prolapse   . Glaucoma   . Hyperlipidemia   . Hypertension   . Hypothyroidism   . IBS (irritable bowel syndrome)   . Incomplete emptying of bladder   . Urethral prolapse     Past Surgical History:  Procedure Laterality Date  . SHOULDER SURGERY       Home Medications:  Prior to Admission medications   Medication Sig Start Date End Date Taking? Authorizing Provider  dorzolamide-timolol (COSOPT) 22.3-6.8 MG/ML ophthalmic solution 1 drop 2 (two) times daily.   Yes [provider]  levothyroxine (SYNTHROID, LEVOTHROID) 75 MCG tablet Take 75 mcg by mouth daily before breakfast.   Yes [provider]  LORazepam (ATIVAN) 0.5 MG tablet Take 1 tablet by mouth 2 (two) times daily. 01/04/16  Yes [provider]  simvastatin (ZOCOR) 40 MG tablet Take 40 mg by mouth daily.   Yes [provider]  timolol (TIMOPTIC) 0.5 % ophthalmic solution Place 1 drop into both eyes daily. 11/17/18  Yes [provider]  Acetaminophen 500 MG coapsule Take 250 mg by mouth daily as needed for pain.    [provider]  aspirin 81 MG tablet Take 81 mg by mouth daily.    [provider]  cephALEXin (KEFLEX) 500 MG capsule Take 1 capsule (500 mg total) by mouth 2 (two) times daily. Patient not taking: Reported on 11/19/2018 08/12/18   Johnn Hai, PA-C  fluticasone [facility]) 50 MCG/ACT nasal spray Place into both nostrils  daily.    [provider]  omeprazole (PRILOSEC) 20 MG capsule Take 20 mg by mouth daily.    [provider]  oxyCODONE (ROXICODONE) 5 MG/5ML solution Take 2 mLs (2 mg total) by mouth every 4 (four) hours as needed for severe pain. Patient not taking: Reported on 11/19/2018 08/12/18   Bridget HartshornSummers, Rhonda L, PA-C  polyethylene glycol powder (GLYCOLAX/MIRALAX) 17 GM/SCOOP powder 1/2 -1 capful once a day with water to prevent constipation.  May put powder in coffee or water 08/12/18   Tommi RumpsSummers, Rhonda L, PA-C     Inpatient Medications: Scheduled Meds: . carvedilol  3.125 mg Oral BID WC  . pantoprazole  20 mg Oral Daily   Continuous Infusions: . heparin 550 Units/hr (11/19/18 1038)   PRN Meds:   Allergies:    Allergies  Allergen Reactions  . Meperidine And Related Other (See Comments)    Cannot remember  . Midazolam Hcl   . Sulfa Antibiotics   . Aleve [Naproxen Sodium] Rash  . Codeine Sulfate Rash    Social History:   Social History   Socioeconomic History  . Marital status: Divorced    Spouse name: Not on file  . Number of children: Not on file  . Years of education: Not on file  . Highest education level: Not on file  Occupational History  . Not on file  Social Needs  . Financial resource strain: Not on file  . Food insecurity    Worry: Not on file    Inability: Not on file  . Transportation needs    Medical: Not on file    Non-medical: Not on file  Tobacco Use  . Smoking status: Former Smoker    Packs/day: 0.50    Years: 30.00    Pack years: 15.00    Types: Cigarettes    Quit date: 08/09/2013    Years since quitting: 5.2  . Smokeless tobacco: Never Used  Substance and Sexual Activity  . Alcohol use: No    Alcohol/week: 0.0 standard drinks  . Drug use: Not on file  . Sexual activity: Not on file  Lifestyle  . Physical activity    Days per week: Not on file    Minutes per session: Not on file  . Stress: Not on file  Relationships  . Social Musicianconnections    Talks on phone: Not on file    Gets together: Not on file    Attends religious service: Not on file    Active member of club or organization: Not on file    Attends meetings of clubs or organizations: Not on file    Relationship status: Not on file  . Intimate partner violence    Fear of current or ex partner: Not on file    Emotionally abused: Not on file    Physically abused: Not on file    Forced sexual activity: Not on file  Other Topics Concern  . Not on file  Social History Narrative  . Not  on file    Family History:    Family History  Problem Relation Age of Onset  . Breast cancer Other 40  . Prostate cancer Neg Hx   . Kidney cancer Neg Hx   . Bladder Cancer Neg Hx      ROS:  Please see the history of present illness.  Review of Systems  Respiratory: Negative for cough, hemoptysis, shortness of breath and wheezing.   Cardiovascular: Negative for chest pain, palpitations, orthopnea and leg swelling.  Gastrointestinal: Negative for blood in stool, melena, nausea and vomiting.  Musculoskeletal: Positive for joint pain.  Neurological: Positive for tingling and sensory change. Negative for loss of consciousness.       Tingling and pain in right fingers, now alleviated. Earlier confusion, now oriented.  All other systems reviewed and are negative.   All other ROS reviewed and negative.     Physical Exam/Data:   Vitals:   11/19/18 0930 11/19/18 0959 11/19/18 1000 11/19/18 1001  BP: (!) 145/92  (!) 146/89   Pulse:  83 84 86  Resp:   18   Temp:      TempSrc:      SpO2:  95% 95% 95%  Weight:      Height:       No intake or output data in the 24 hours ending 11/19/18 1244 Last 3 Weights 11/19/2018 08/12/2018 03/09/2016  Weight (lbs) 102 lb 105 lb 128 lb  Weight (kg) 46.267 kg 47.628 kg 58.06 kg     Body mass index is 19.92 kg/m.  General:  Elderly female, in no acute distress. Lying in bed HEENT: normal Neck: no JVD Vascular: No carotid bruits; radial pulses 2+ bilaterally Cardiac:  normal S1, S2; RRR; no murmur Lungs:  clear to auscultation bilaterally, no wheezing, rhonchi or rales  Abd: soft, nontender, no hepatomegaly  Ext: no edema Musculoskeletal:  No deformities, BUE and BLE strength normal and equal Skin: warm and dry  Neuro:  No focal abnormalities noted Psych:  Normal affect   EKG:  The EKG was personally reviewed and demonstrates:  TWI in inferior leads, possible previous septal infarct Telemetry:  Telemetry was personally reviewed and  demonstrates:  NSR, HR 80-90s, PVCs   Relevant CV Studies: None   Laboratory Data:  High Sensitivity Troponin:   Recent Labs  Lab 11/19/18 0818 11/19/18 1026  TROPONINIHS 3,372* 3,796*     Cardiac EnzymesNo results for input(s): TROPONINI in the last 168 hours. No results for input(s): TROPIPOC in the last 168 hours.  Chemistry Recent Labs  Lab 11/19/18 0818  NA 134*  K 3.7  CL 97*  CO2 25  GLUCOSE 105*  BUN 10  CREATININE 0.50  CALCIUM 8.9  GFRNONAA >60  GFRAA >60  ANIONGAP 12    Recent Labs  Lab 11/19/18 0818  PROT 6.6  ALBUMIN 3.9  AST 57*  ALT 16  ALKPHOS 84  BILITOT 0.9   Hematology Recent Labs  Lab 11/19/18 0818  WBC 8.6  RBC 4.19  HGB 12.6  HCT 37.8  MCV 90.2  MCH 30.1  MCHC 33.3  RDW 13.8  PLT 260   BNPNo results for input(s): BNP, PROBNP in the last 168 hours.  DDimer No results for input(s): DDIMER in the last 168 hours.   Radiology/Studies:  Ct Head Wo Contrast  Result Date: 11/19/2018 CLINICAL DATA:  Confusion since yesterday. Left arm numbness since 3:30 a.m. today. EXAM: CT HEAD WITHOUT CONTRAST TECHNIQUE: Contiguous axial images were obtained from the base of the skull through the vertex without intravenous contrast. COMPARISON:  05/26/2013 FINDINGS: Brain: Moderately enlarged ventricles and subarachnoid spaces. Moderate to marked patchy white matter low density in both cerebral hemispheres. Vascular: No hyperdense vessel or unexpected calcification. Skull: Normal. Negative for fracture or focal lesion. Sinuses/Orbits: Status post bilateral cataract extraction. Mild right ethmoid sinus mucosal thickening. Other: None. IMPRESSION: 1. No acute abnormality. 2. Moderate to marked diffuse cerebral and cerebellar atrophy. 3. Moderate to marked chronic small vessel white matter ischemic  changes in both cerebral hemispheres. 4. Mild chronic right ethmoid sinusitis. Electronically Signed   By: Beckie Salts M.D.   On: 11/19/2018 08:25     Assessment and Plan:   NSTEMI --No chest pain. Previous disorientation with "funny feeling" in right hand fingers. Currently patient is oriented and no further "funny feeling" in right hand fingers. Consider earlier disorientation 2/2 hyponatremia as below. CT head without acute abnormality. --HS Tn elevation elevation as above and last 3,796.0. Continue to cycle until peaked and down-trending. EKG as above with new TWI when compared with previous EKG as in HPI.  --Patient risk factors for cardiac etiology include previous history of smoking, HLD, HTN (with no previous antihypertensive medication), previous CT study with aortic atherosclerosis on scan (2019). --Pending Hgb A1C, TSH, and Lipid panel labs for further risk factor stratification. --Further ischemic workup recommended with cardiac catheterization given HS Tn elevation, EKG, and risk factors. However, after lengthy discussion of further ischemic workup with pt, she indicated her preference for noninvasive workup at this time. This preference was discussed with the daughter by primary MD over the telephone as well.  --Echo ordered +/- stress test. If EF significantly reduced or stress test positive for cardiac ischemia, recommend further discussion with patient / family regarding cardiac consideration of catheterization.  --Continue medical management per patient preference for now. Given elevated BP and HR, started on small dose BB in the ED. Continue with ASA and PTA PPI. Continue heparin for another 48h or until cath performed, if decision made for cath. Daily CBC recommended as on heparin. Daily BMET to monitor electrolytes and renal function. Further recommendations pending echo.   HLD -Continue statin therapy. --Liver function shows elevation in AST at 57, ALT 16. --Lipid panel ordered and pending.  HTN --No PTA antihypertensive medication regimen. --Poorly controlled. --Started on small dose of beta-blocker in the emergency  department.  Titrate as heart rate allows.  Further recommendations pending EF/echo.   Hyponatremia --Na borderline low. Consider as etiology of earlier confusion. Unclear etiology of hyponatremia at this time - no known heart failure with EF / echo pending.   Hypokalemia --K 3.7. Replete with goal 4.0. Check Mg. --Daily BMET.  Previous tobacco abuse --Quit smoking 2 years prior. Previous long history of smoking.     For questions or updates, please contact [facility] Please consult [email or web address] for contact info under     Signed, Lennon Alstrom, PA-C  11/19/2018 12:44 PM

## 2018-11-19 NOTE — H&P (Signed)
[facility] [place] [facility]   PATIENT NAME: Erica PaulsBetty Perry    MR#:  [id number]  DATE OF BIRTH:  Mar 16, 1924  DATE OF ADMISSION:  11/19/2018  PRIMARY CARE PHYSICIAN: Gracelyn NurseJohnston, John D, MD   REQUESTING/REFERRING PHYSICIAN:   CHIEF COMPLAINT:   Chief Complaint  Patient presents with   Numbness   Altered Mental Status   Confusion. HISTORY OF PRESENT ILLNESS:  Erica Perry  is a 85 y.o. female with a known history of COPD, hypertension, hyperlipidemia, hypothyroidism, urethral prolapse, compression fracture of thoracic vertebra, etc.  The patient presents the ED with above chief complaints.  She is found confusion since yesterday.  The patient also complains of numbness of left arm and hand this morning.  She is sent to ED this morning.  She denies any fever or chills, no chest pain, palpitation, diaphoresis or nausea or vomiting.  She is alert, awake and oriented now.  Her troponin is elevated at 3372, EKG shows some T wave abnormality.  Heparin drip is started.  ED physician request admission. PAST MEDICAL HISTORY:   Past Medical History:  Diagnosis Date   Compression fracture of thoracic vertebra (HCC)    COPD (chronic obstructive pulmonary disease) (HCC)    Depression    Dysuria    Female bladder prolapse    Glaucoma    Hyperlipidemia    Hypertension    Hypothyroidism    IBS (irritable bowel syndrome)    Incomplete emptying of bladder    Urethral prolapse     PAST SURGICAL HISTORY:   Past Surgical History:  Procedure Laterality Date   SHOULDER SURGERY      SOCIAL HISTORY:   Social History   Tobacco Use   Smoking status: Former Smoker    Packs/day: 0.50    Years: 30.00    Pack years: 15.00    Types: Cigarettes    Quit date: 08/09/2013    Years since quitting: 5.2   Smokeless tobacco: Never Used  Substance Use Topics   Alcohol use: No    Alcohol/week: 0.0 standard drinks    FAMILY HISTORY:   Family History  Problem  Relation Age of Onset   Breast cancer Other 40   Prostate cancer Neg Hx    Kidney cancer Neg Hx    Bladder Cancer Neg Hx     DRUG ALLERGIES:   Allergies  Allergen Reactions   Meperidine And Related Other (See Comments)    Cannot remember   Midazolam Hcl    Sulfa Antibiotics    Aleve [Naproxen Sodium] Rash   Codeine Sulfate Rash    REVIEW OF SYSTEMS:   Review of Systems  Constitutional: Negative for chills, fever and malaise/fatigue.  HENT: Negative for sore throat.   Eyes: Negative for blurred vision and double vision.  Respiratory: Negative for cough, hemoptysis, shortness of breath, wheezing and stridor.   Cardiovascular: Negative for chest pain, palpitations, orthopnea and leg swelling.  Gastrointestinal: Negative for abdominal pain, blood in stool, diarrhea, melena, nausea and vomiting.  Genitourinary: Negative for dysuria, flank pain and hematuria.  Musculoskeletal: Positive for joint pain. Negative for back pain.       Left hand and arm numbness.  Right hip pain.  Skin: Negative for rash.  Neurological: Negative for dizziness, sensory change, focal weakness, seizures, loss of consciousness, weakness and headaches.  Endo/Heme/Allergies: Negative for polydipsia.  Psychiatric/Behavioral: Negative for depression. The patient is not nervous/anxious.     MEDICATIONS AT HOME:   Prior to  Admission medications   Medication Sig Start Date End Date Taking? Authorizing Provider  dorzolamide-timolol (COSOPT) 22.3-6.8 MG/ML ophthalmic solution 1 drop 2 (two) times daily.   Yes [provider]  levothyroxine (SYNTHROID, LEVOTHROID) 75 MCG tablet Take 75 mcg by mouth daily before breakfast.   Yes [provider]  LORazepam (ATIVAN) 0.5 MG tablet Take 1 tablet by mouth 2 (two) times daily. 01/04/16  Yes [provider]  simvastatin (ZOCOR) 40 MG tablet Take 40 mg by mouth daily.   Yes [provider]  timolol (TIMOPTIC) 0.5 % ophthalmic  solution Place 1 drop into both eyes daily. 11/17/18  Yes [provider]  Acetaminophen 500 MG coapsule Take 250 mg by mouth daily as needed for pain.    [provider]  aspirin 81 MG tablet Take 81 mg by mouth daily.    [provider]  cephALEXin (KEFLEX) 500 MG capsule Take 1 capsule (500 mg total) by mouth 2 (two) times daily. Patient not taking: Reported on 11/19/2018 08/12/18   Johnn Hai, PA-C  fluticasone [facility]) 50 MCG/ACT nasal spray Place into both nostrils daily.    [provider]  omeprazole (PRILOSEC) 20 MG capsule Take 20 mg by mouth daily.    [provider]  oxyCODONE (ROXICODONE) 5 MG/5ML solution Take 2 mLs (2 mg total) by mouth every 4 (four) hours as needed for severe pain. Patient not taking: Reported on 11/19/2018 08/12/18   Letitia Neri L, PA-C  polyethylene glycol powder (GLYCOLAX/MIRALAX) 17 GM/SCOOP powder 1/2 -1 capful once a day with water to prevent constipation.  May put powder in coffee or water 08/12/18   Letitia Neri L, PA-C      VITAL SIGNS:  Blood pressure (!) 146/89, pulse 86, temperature 98.3 F (36.8 C), temperature source Oral, resp. rate 18, height 5' (1.524 m), weight 46.3 kg, SpO2 95 %.  PHYSICAL EXAMINATION:  Physical Exam  GENERAL:  85 y.o.-year-old patient lying in the bed with no acute distress.  EYES: Pupils equal, round, reactive to light and accommodation. No scleral icterus. Extraocular muscles intact.  HEENT: Head atraumatic, normocephalic. NECK:  Supple, no jugular venous distention. No thyroid enlargement, no tenderness.  LUNGS: Normal breath sounds bilaterally, no wheezing, rales,rhonchi or crepitation. No use of accessory muscles of respiration.  CARDIOVASCULAR: S1, S2 normal. No murmurs, rubs, or gallops.  ABDOMEN: Soft, nontender, nondistended. Bowel sounds present. No organomegaly or mass.  EXTREMITIES: No pedal edema, cyanosis, or clubbing.  NEUROLOGIC: Cranial nerves II  through XII are intact. Muscle strength 5/5 in all extremities. Sensation intact. Gait not checked.  PSYCHIATRIC: The patient is alert and oriented x 3.  SKIN: No obvious rash, lesion, or ulcer.   LABORATORY PANEL:   CBC Recent Labs  Lab 11/19/18 0818  WBC 8.6  HGB 12.6  HCT 37.8  PLT 260   ------------------------------------------------------------------------------------------------------------------  Chemistries  Recent Labs  Lab 11/19/18 0818  NA 134*  K 3.7  CL 97*  CO2 25  GLUCOSE 105*  BUN 10  CREATININE 0.50  CALCIUM 8.9  AST 57*  ALT 16  ALKPHOS 84  BILITOT 0.9   ------------------------------------------------------------------------------------------------------------------  Cardiac Enzymes No results for input(s): TROPONINI in the last 168 hours. ------------------------------------------------------------------------------------------------------------------  RADIOLOGY:  Ct Head Wo Contrast  Result Date: 11/19/2018 CLINICAL DATA:  Confusion since yesterday. Left arm numbness since 3:30 a.m. today. EXAM: CT HEAD WITHOUT CONTRAST TECHNIQUE: Contiguous axial images were obtained from the base of the skull through the vertex without intravenous contrast.  COMPARISON:  05/26/2013 FINDINGS: Brain: Moderately enlarged ventricles and subarachnoid spaces. Moderate to marked patchy white matter low density in both cerebral hemispheres. Vascular: No hyperdense vessel or unexpected calcification. Skull: Normal. Negative for fracture or focal lesion. Sinuses/Orbits: Status post bilateral cataract extraction. Mild right ethmoid sinus mucosal thickening. Other: None. IMPRESSION: 1. No acute abnormality. 2. Moderate to marked diffuse cerebral and cerebellar atrophy. 3. Moderate to marked chronic small vessel white matter ischemic changes in both cerebral hemispheres. 4. Mild chronic right ethmoid sinusitis. Electronically Signed   By: Beckie Salts M.D.   On: 11/19/2018 08:25     IMPRESSION AND PLAN:   Non-STEMI. The patient will be admitted to telemetry floor.  Continue heparin drip, aspirin and Lipitor, follow-up echocardiogram and cardiology consult.  Hypertension.  Start Lopressor. Hyperlipidemia.  Hold Zocor and start Lipitor. COPD.  Stable.  Albuterol as needed. Hypothyroidism.  Continue Synthyroid.  All the records are reviewed and case discussed with ED provider. Management plans discussed with the patient, her daughter and they are in agreement.  CODE STATUS: DNR.  TOTAL TIME TAKING CARE OF THIS PATIENT: 52 minutes.   Shaune Pollack M.D on 11/19/2018 at 12:04 PM  Between 7am to 6pm - Pager - [phone]  After 6pm go to [email or web address] - Social research officer, government  [facility] [place] [facility]  Office  [phone]  CC: Primary care physician; Gracelyn Nurse, MD   Note: This dictation was prepared with Dragon dictation along with smaller phrase technology. Any transcriptional errors that result from this process are unin

## 2018-11-19 NOTE — Consult Note (Signed)
ANTICOAGULATION CONSULT NOTE - Initial Consult  Pharmacy Consult for Heparin Infusion Dosing and Monitoring  Indication: chest pain/ACS  Allergies  Allergen Reactions  . Meperidine And Related Other (See Comments)    Cannot remember  . Midazolam Hcl   . Sulfa Antibiotics   . Aleve [Naproxen Sodium] Rash  . Codeine Sulfate Rash   Patient Measurements: Height: 5' (152.4 cm) Weight: 102 lb (46.3 kg) IBW/kg (Calculated) : 45.5  Vital Signs: Temp: 98.3 F (36.8 C) (09/10 0721) Temp Source: Oral (09/10 0721) BP: 146/89 (09/10 1000) Pulse Rate: 86 (09/10 1001)  Labs: Recent Labs    11/19/18 0818  HGB 12.6  HCT 37.8  PLT 260  APTT 29  LABPROT 13.2  INR 1.0  CREATININE 0.50  TROPONINIHS 3,372*    Estimated Creatinine Clearance: 32.9 mL/min (by C-G formula based on SCr of 0.5 mg/dL).   Medical History: Past Medical History:  Diagnosis Date  . Compression fracture of thoracic vertebra (HCC)   . COPD (chronic obstructive pulmonary disease) ([place])   . Depression   . Dysuria   . Female bladder prolapse   . Glaucoma   . Hyperlipidemia   . Hypertension   . Hypothyroidism   . IBS (irritable bowel syndrome)   . Incomplete emptying of bladder   . Urethral prolapse     Assessment: Pharmacy consulted for heparin infusion dosing and monitoring in 85 yo female for ACS/STEMI. No reported AC prior to admission.   Troponin (HS): 3372   Goal of Therapy:  Heparin level 0.3-0.7 units/ml Monitor platelets by anticoagulation protocol: Yes   Plan:  Give 2700 units bolus x 1 Start heparin infusion at 550 units/hr Check anti-Xa level in 8 hours and daily while on heparin Continue to monitor H&H and platelets  Pernell Dupre, PharmD, BCPS Clinical Pharmacist 11/19/2018 11:07 AM

## 2018-11-19 NOTE — Consult Note (Signed)
ANTICOAGULATION CONSULT NOTE - Initial Consult  Pharmacy Consult for Heparin Infusion Dosing and Monitoring  Indication: chest pain/ACS  Allergies  Allergen Reactions  . Meperidine And Related Other (See Comments)    Cannot remember  . Midazolam Hcl   . Sulfa Antibiotics   . Aleve [Naproxen Sodium] Rash  . Codeine Sulfate Rash   Patient Measurements: Height: 5\' 4"  (162.6 cm) Weight: 102 lb 1.6 oz (46.3 kg) IBW/kg (Calculated) : 54.7  Vital Signs: Temp: 98.2 F (36.8 C) (09/10 1643) Temp Source: Oral (09/10 1643) BP: 152/93 (09/10 1643) Pulse Rate: 85 (09/10 1643)  Labs: Recent Labs    11/19/18 0818 11/19/18 1026 11/19/18 1647 11/19/18 1847  HGB 12.6  --   --   --   HCT 37.8  --   --   --   PLT 260  --   --   --   APTT 29  --   --   --   LABPROT 13.2  --   --   --   INR 1.0  --   --   --   HEPARINUNFRC  --   --   --  0.46  CREATININE 0.50  --   --   --   TROPONINIHS 3,372* 3,796* 3,101*  --     Estimated Creatinine Clearance: 33.5 mL/min (by C-G formula based on SCr of 0.5 mg/dL).   Medical History: Past Medical History:  Diagnosis Date  . Compression fracture of thoracic vertebra (HCC)   . COPD (chronic obstructive pulmonary disease) ([place])   . Depression   . Dysuria   . Female bladder prolapse   . Glaucoma   . Hyperlipidemia   . Hypertension   . Hypothyroidism   . IBS (irritable bowel syndrome)   . Incomplete emptying of bladder   . Urethral prolapse     Assessment: Pharmacy consulted for heparin infusion dosing and monitoring in 85 yo female for ACS/STEMI. No reported AC prior to admission.   Troponin (HS): 3372   Goal of Therapy:  Heparin level 0.3-0.7 units/ml Monitor platelets by anticoagulation protocol: Yes   Plan:  9/10 1847 HL 0.46 therapeutic. Continue heparin drip at 550 units/hr. HL 9/11 at 0300 to confirm. CBC with morning labs.  Tawnya Crook, PharmD Clinical Pharmacist 11/19/2018 7:18 PM

## 2018-11-20 ENCOUNTER — Encounter
Admission: EM | Disposition: A | Discharge status: Home / Self Care | Payer: Self-pay | Source: Home / Self Care | Attending: Internal Medicine

## 2018-11-20 ENCOUNTER — Inpatient Hospital Stay (HOSPITAL_COMMUNITY)
Admit: 2018-11-20 | Discharge: 2018-11-20 | Disposition: A | Discharge status: Home / Self Care | Payer: Medicare Other | Attending: Cardiovascular Disease | Admitting: Cardiovascular Disease

## 2018-11-20 ENCOUNTER — Encounter: Payer: Self-pay | Admitting: *Deleted

## 2018-11-20 DIAGNOSIS — I249 Acute ischemic heart disease, unspecified: Secondary | ICD-10-CM

## 2018-11-20 DIAGNOSIS — I361 Nonrheumatic tricuspid (valve) insufficiency: Secondary | ICD-10-CM

## 2018-11-20 DIAGNOSIS — I340 Nonrheumatic mitral (valve) insufficiency: Secondary | ICD-10-CM

## 2018-11-20 HISTORY — PX: LEFT HEART CATH AND CORONARY ANGIOGRAPHY: CATH118249

## 2018-11-20 LAB — CBC
HCT: 34.8 % — ABNORMAL LOW (ref 36.0–46.0)
Hemoglobin: 11.5 g/dL — ABNORMAL LOW (ref 12.0–15.0)
MCH: 29.6 pg (ref 26.0–34.0)
MCHC: 33 g/dL (ref 30.0–36.0)
MCV: 89.5 fL (ref 80.0–100.0)
Platelets: 244 10*3/uL (ref 150–400)
RBC: 3.89 MIL/uL (ref 3.87–5.11)
RDW: 13.5 % (ref 11.5–15.5)
WBC: 8.5 10*3/uL (ref 4.0–10.5)
nRBC: 0 % (ref 0.0–0.2)

## 2018-11-20 LAB — ECHOCARDIOGRAM COMPLETE
Height: 64 in
Weight: 1633.6 oz

## 2018-11-20 LAB — LIPID PANEL
Cholesterol: 125 mg/dL (ref 0–200)
HDL: 58 mg/dL (ref >40)
LDL Cholesterol: 59 mg/dL (ref 0–99)
Total CHOL/HDL Ratio: 2.2 RATIO
Triglycerides: 38 mg/dL (ref <150)
VLDL: 8 mg/dL (ref 0–40)

## 2018-11-20 LAB — HEMOGLOBIN A1C
Hgb A1c MFr Bld: 5.9 % — ABNORMAL HIGH (ref 4.8–5.6)
Mean Plasma Glucose: 123 mg/dL

## 2018-11-20 LAB — HEPARIN LEVEL (UNFRACTIONATED): Heparin Unfractionated: 0.43 IU/mL (ref 0.30–0.70)

## 2018-11-20 LAB — CARDIAC CATHETERIZATION: Cath EF Quantitative: 35 %

## 2018-11-20 SURGERY — LEFT HEART CATH AND CORONARY ANGIOGRAPHY
Anesthesia: Moderate Sedation

## 2018-11-20 MED ORDER — FENTANYL CITRATE (PF) 100 MCG/2ML IJ SOLN
INTRAMUSCULAR | Status: DC | PRN
  Administered 2018-11-20 (×2): 25 ug via INTRAVENOUS

## 2018-11-20 MED ORDER — FENTANYL CITRATE (PF) 100 MCG/2ML IJ SOLN
INTRAMUSCULAR | Status: AC
  Filled 2018-11-20: qty 2

## 2018-11-20 MED ORDER — ADULT MULTIVITAMIN W/MINERALS CH
1.0000 | ORAL_TABLET | Freq: Every day | ORAL | Status: DC
  Administered 2018-11-21: 1 via ORAL
  Filled 2018-11-20: qty 1

## 2018-11-20 MED ORDER — HEPARIN (PORCINE) IN NACL 1000-0.9 UT/500ML-% IV SOLN
INTRAVENOUS | Status: AC
  Filled 2018-11-20: qty 500

## 2018-11-20 MED ORDER — SODIUM CHLORIDE 0.9 % IV SOLN: 250.0000 mL | INTRAVENOUS | Status: DC | PRN

## 2018-11-20 MED ORDER — ENSURE ENLIVE PO LIQD: 237.0000 mL | Freq: Two times a day (BID) | ORAL | Status: DC

## 2018-11-20 MED ORDER — SODIUM CHLORIDE 0.9 % WEIGHT BASED INFUSION: 1.0000 mL/kg/h | INTRAVENOUS | Status: DC

## 2018-11-20 MED ORDER — SODIUM CHLORIDE 0.9% FLUSH: 3.0000 mL | INTRAVENOUS | Status: DC | PRN

## 2018-11-20 MED ORDER — ASPIRIN 81 MG PO CHEW: 81.0000 mg | CHEWABLE_TABLET | ORAL | Status: DC

## 2018-11-20 MED ORDER — SODIUM CHLORIDE 0.9 % WEIGHT BASED INFUSION
3.0000 mL/kg/h | INTRAVENOUS | Status: DC
  Administered 2018-11-20: 3 mL/kg/h via INTRAVENOUS

## 2018-11-20 MED ORDER — INFLUENZA VAC A&B SA ADJ QUAD 0.5 ML IM PRSY
0.5000 mL | PREFILLED_SYRINGE | INTRAMUSCULAR | Status: AC
  Administered 2018-11-20: 0.5 mL via INTRAMUSCULAR
  Filled 2018-11-20: qty 0.5

## 2018-11-20 MED ORDER — SODIUM CHLORIDE 0.9% FLUSH: 3.0000 mL | Freq: Two times a day (BID) | INTRAVENOUS | Status: DC

## 2018-11-20 MED ORDER — HEPARIN (PORCINE) IN NACL 1000-0.9 UT/500ML-% IV SOLN
INTRAVENOUS | Status: DC | PRN
  Administered 2018-11-20: 500 mL

## 2018-11-20 MED ORDER — IOHEXOL 300 MG/ML  SOLN
INTRAMUSCULAR | Status: DC | PRN
  Administered 2018-11-20: 115 mL

## 2018-11-20 SURGICAL SUPPLY — 10 items
CATH INFINITI 5FR ANG PIGTAIL (CATHETERS) ×2 IMPLANT
CATH INFINITI 5FR JL4 (CATHETERS) ×2 IMPLANT
CATH INFINITI JR4 5F (CATHETERS) ×2 IMPLANT
DEVICE CLOSURE MYNXGRIP 5F (Vascular Products) ×2 IMPLANT
KIT MANI 3VAL PERCEP (MISCELLANEOUS) ×2 IMPLANT
NEEDLE PERC 18GX7CM (NEEDLE) ×2 IMPLANT
NEEDLE SMART 18G ACCESS (NEEDLE) ×2 IMPLANT
PACK CARDIAC CATH (CUSTOM PROCEDURE TRAY) ×2 IMPLANT
SHEATH AVANTI 5FR X 11CM (SHEATH) ×2 IMPLANT
WIRE GUIDERIGHT .035X150 (WIRE) ×2 IMPLANT

## 2018-11-20 NOTE — Progress Notes (Signed)
[place] at [place] NAME: Tyshawn Keel    MR#:  [id number]  DATE OF BIRTH:  1924/06/26  SUBJECTIVE:  CHIEF COMPLAINT:   Chief Complaint  Patient presents with  . Numbness  . Altered Mental Status   The patient has no complaints. REVIEW OF SYSTEMS:  Review of Systems  Constitutional: Negative for chills, fever and malaise/fatigue.  HENT: Negative for sore throat.   Eyes: Negative for blurred vision and double vision.  Respiratory: Negative for cough, hemoptysis, shortness of breath, wheezing and stridor.   Cardiovascular: Negative for chest pain, palpitations, orthopnea and leg swelling.  Gastrointestinal: Negative for abdominal pain, blood in stool, diarrhea, melena, nausea and vomiting.  Genitourinary: Negative for dysuria, flank pain and hematuria.  Musculoskeletal: Negative for back pain and joint pain.  Skin: Negative for rash.  Neurological: Negative for dizziness, sensory change, focal weakness, seizures, loss of consciousness, weakness and headaches.  Endo/Heme/Allergies: Negative for polydipsia.  Psychiatric/Behavioral: Negative for depression. The patient is not nervous/anxious.     DRUG ALLERGIES:   Allergies  Allergen Reactions  . Meperidine And Related Other (See Comments)    Cannot remember  . Midazolam Hcl   . Sulfa Antibiotics   . Aleve [Naproxen Sodium] Rash  . Codeine Sulfate Rash   VITALS:  Blood pressure (!) 155/89, pulse 78, temperature 97.7 F (36.5 C), temperature source Oral, resp. rate 18, height 5\' 4"  (1.626 m), weight 46.3 kg, SpO2 97 %. PHYSICAL EXAMINATION:  Physical Exam Constitutional:      General: She is not in acute distress.    Appearance: Normal appearance.  HENT:     Head: Normocephalic.     Mouth/Throat:     Mouth: Mucous membranes are moist.  Eyes:     General: No scleral icterus.    Conjunctiva/sclera: Conjunctivae normal.     Pupils: Pupils are equal, round, and reactive to  light.  Neck:     Musculoskeletal: Normal range of motion and neck supple.     Vascular: No JVD.     Trachea: No tracheal deviation.  Cardiovascular:     Rate and Rhythm: Normal rate and regular rhythm.     Heart sounds: Normal heart sounds. No murmur. No gallop.   Pulmonary:     Effort: Pulmonary effort is normal. No respiratory distress.     Breath sounds: Normal breath sounds. No wheezing or rales.  Abdominal:     General: Bowel sounds are normal. There is no distension.     Palpations: Abdomen is soft.     Tenderness: There is no abdominal tenderness. There is no rebound.  Musculoskeletal: Normal range of motion.        General: No tenderness.     Right lower leg: No edema.     Left lower leg: No edema.  Skin:    Findings: No erythema or rash.  Neurological:     General: No focal deficit present.     Mental Status: She is alert and oriented to person, place, and time.     Cranial Nerves: No cranial nerve deficit.  Psychiatric:        Mood and Affect: Mood normal.    LABORATORY PANEL:  Female CBC Recent Labs  Lab 11/20/18 0306  WBC 8.5  HGB 11.5*  HCT 34.8*  PLT 244   ------------------------------------------------------------------------------------------------------------------ Chemistries  Recent Labs  Lab 11/19/18 0818  NA 134*  K 3.7  CL 97*  CO2 25  GLUCOSE 105*  BUN 10  CREATININE 0.50  CALCIUM 8.9  AST 57*  ALT 16  ALKPHOS 84  BILITOT 0.9   RADIOLOGY:  No results found. ASSESSMENT AND PLAN:   Non-STEMI. Continue heparin drip, aspirin and Lipitor. Cardiac cath this afternoon.  The patient agreed to get cardiac cath after discussion with Dr. Mariah Milling.  Echocardiograph: The left ventricle has moderately reduced systolic function, with an ejection fraction of 35-40%. The cavity size was normal. Left ventricular diastolic Doppler parameters are consistent with impaired relaxation. Severe hypokinesis of the mid to  apical anterior wall, anteroseptal  wall and apical region.  Hypertension.    Continue Coreg. Hyperlipidemia.  Hold Zocor and started Lipitor. COPD.  Stable.  Albuterol as needed. Hypothyroidism.  Continue Synthyroid.  I discussed with Dr. Mariah Milling. All the records are reviewed and case discussed with Care Management/Social Worker. Management plans discussed with the patient, family and they are in agreement.  CODE STATUS: DNR  TOTAL TIME TAKING CARE OF THIS PATIENT: 27 minutes.   More than 50% of the time was spent in counseling/coordination of care: YES  POSSIBLE D/C IN 2 DAYS, DEPENDING ON CLINICAL CONDITION.   Shaune Pollack M.D on 11/20/2018 at 2:33 PM  Between 7am to 6pm - Pager - [phone]  After 6pm go to [email or web address] - Therapist, nutritional Hospitalists

## 2018-11-20 NOTE — Progress Notes (Signed)
*  PRELIMINARY RESULTS* Echocardiogram 2D Echocardiogram has been performed.  Erica Perry 11/20/2018, 8:52 AM

## 2018-11-20 NOTE — Progress Notes (Signed)
Initial Nutrition Assessment  DOCUMENTATION CODES:   Underweight  INTERVENTION:   Ensure Enlive po BID, each supplement provides 350 kcal and 20 grams of protein  MVI daily   NUTRITION DIAGNOSIS:   Increased nutrient needs related to catabolic illness(COPD) as evidenced by increased estimated needs.  GOAL:   Patient will meet greater than or equal to 90% of their needs  MONITOR:   PO intake, Supplement acceptance, Labs, Weight trends, Skin, I & O's  REASON FOR ASSESSMENT:   Malnutrition Screening Tool    ASSESSMENT:   85 y.o. female with a history of COPD, depression, hypertension, hyperlipidemia, IBS who presents to the ED for confusion and found to have NSTEMI  RD working remotely.  Pt with good appetite and oral intake; pt eating 95% of meals prior to NPO yesterday. RD will add supplements to help pt meet hER estimated needs as pt with COPD.Per chart, pt appears weight stable pta.  Pt at high risk for malnutrition but unable to diagnose at this time as NFPE cannot be performed.   Medications reviewed and include: aspirin, synthroid, protonix   Labs reviewed: Na 134(L), K 3.7 wnl  Unable to complete Nutrition-Focused physical exam at this time.   Diet Order:   Diet Order            Diet NPO time specified  Diet effective now             EDUCATION NEEDS:   No education needs have been identified at this time  Skin:  Skin Assessment: Reviewed RN Assessment(ecchymosis)  Last BM:  9/9  Height:   Ht Readings from Last 1 Encounters:  11/19/18 5\' 4"  (1.626 m)    Weight:   Wt Readings from Last 1 Encounters:  11/19/18 46.3 kg    Ideal Body Weight:  54.5 kg  BMI:  Body mass index is 17.53 kg/m.  Estimated Nutritional Needs:   Kcal:  1200-1400kcal/day  Protein:  70-78g/day  Fluid:  >1.2L/day  Koleen Distance MS, RD, LDN Pager #- [phone] Office#- [phone] After Hours Pager: [phone]

## 2018-11-20 NOTE — Consult Note (Signed)
ANTICOAGULATION CONSULT NOTE - Initial Consult  Pharmacy Consult for Heparin Infusion Dosing and Monitoring  Indication: chest pain/ACS  Allergies  Allergen Reactions  . Meperidine And Related Other (See Comments)    Cannot remember  . Midazolam Hcl   . Sulfa Antibiotics   . Aleve [Naproxen Sodium] Rash  . Codeine Sulfate Rash   Patient Measurements: Height: 5\' 4"  (162.6 cm) Weight: 102 lb 1.6 oz (46.3 kg) IBW/kg (Calculated) : 54.7  Vital Signs: Temp: 97.7 F (36.5 C) (09/10 1933) Temp Source: Oral (09/10 1933) BP: 132/81 (09/10 1933) Pulse Rate: 83 (09/10 1933)  Labs: Recent Labs    11/19/18 0818 11/19/18 1026 11/19/18 1647 11/19/18 1847 11/20/18 0306  HGB 12.6  --   --   --  11.5*  HCT 37.8  --   --   --  34.8*  PLT 260  --   --   --  244  APTT 29  --   --   --   --   LABPROT 13.2  --   --   --   --   INR 1.0  --   --   --   --   HEPARINUNFRC  --   --   --  0.46 0.43  CREATININE 0.50  --   --   --   --   TROPONINIHS 3,372* 3,796* 3,101* 3,094*  --     Estimated Creatinine Clearance: 33.5 mL/min (by C-G formula based on SCr of 0.5 mg/dL).   Medical History: Past Medical History:  Diagnosis Date  . Compression fracture of thoracic vertebra (HCC)   . COPD (chronic obstructive pulmonary disease) ([place])   . Depression   . Dysuria   . Female bladder prolapse   . Glaucoma   . Hyperlipidemia   . Hypertension   . Hypothyroidism   . IBS (irritable bowel syndrome)   . Incomplete emptying of bladder   . Urethral prolapse     Assessment: Pharmacy consulted for heparin infusion dosing and monitoring in 85 yo female for ACS/STEMI. No reported AC prior to admission.   Troponin (HS): 3372   Goal of Therapy:  Heparin level 0.3-0.7 units/ml Monitor platelets by anticoagulation protocol: Yes   Plan:  9/11 0300 HL 0.43 therapeutic. Continue heparin drip at 550 units/hr. HL 9/12 w/ am labs. CBC trended down, but stable will continue to monitor.  Tobie Lords, PharmD Clinical Pharmacist 11/20/2018 4:03 AM

## 2018-11-20 NOTE — Plan of Care (Signed)

## 2018-11-20 NOTE — Progress Notes (Signed)
Progress Note  Patient Name: Erica Perry Date of Encounter: 11/20/2018  Primary Cardiologist: New to [facility]  Subjective   No complaints overnight, still concerned about numbness in her fingers No family at the bedside Ate breakfast this morning Prelim echo reviewed personally by myself showing severe wall motion abnormality mid to distal anterior wall, anteroseptal region, apical region   Inpatient Medications    Scheduled Meds: . aspirin EC  81 mg Oral Daily  . atorvastatin  40 mg Oral q1800  . carvedilol  3.125 mg Oral BID WC  . [START ON 11/21/2018] feeding supplement (ENSURE ENLIVE)  237 mL Oral BID BM  . levothyroxine  75 mcg Oral Q0600  . LORazepam  0.5 mg Oral BID  . [START ON 11/21/2018] multivitamin with minerals  1 tablet Oral Daily  . pantoprazole  40 mg Oral Daily  . sodium chloride flush  3 mL Intravenous Q12H  . timolol  1 drop Both Eyes Daily   Continuous Infusions: . sodium chloride    . heparin 550 Units/hr (11/19/18 1038)   PRN Meds: sodium chloride, acetaminophen, acetaminophen, albuterol, bisacodyl, nitroGLYCERIN, ondansetron (ZOFRAN) IV, ondansetron (ZOFRAN) IV, senna, sodium chloride flush, zolpidem   Vital Signs    Vitals:   11/19/18 1643 11/19/18 1933 11/20/18 0412 11/20/18 0859  BP: (!) 152/93 132/81 124/68 139/86  Pulse: 85 83 67 80  Resp: 18 18 18 16   Temp: 98.2 F (36.8 C) 97.7 F (36.5 C) 98.1 F (36.7 C) 98.7 F (37.1 C)  TempSrc: Oral Oral Oral Oral  SpO2: 98% 92% 96% 95%  Weight: 46.3 kg     Height: 5\' 4"  (1.626 m)       Intake/Output Summary (Last 24 hours) at 11/20/2018 1227 Last data filed at 11/20/2018 1023 Gross per 24 hour  Intake 506.15 ml  Output 50 ml  Net 456.15 ml   Last 3 Weights 11/19/2018 11/19/2018 08/12/2018  Weight (lbs) 102 lb 1.6 oz 102 lb 105 lb  Weight (kg) 46.312 kg 46.267 kg 47.628 kg      Telemetry    Normal sinus rhythm- Personally Reviewed  ECG     - Personally Reviewed  Physical  Exam   GEN: No acute distress.   Neck: No JVD Cardiac: RRR, no murmurs, rubs, or gallops.  Respiratory: Clear to auscultation bilaterally. GI: Soft, nontender, non-distended  MS: No edema; No deformity. Neuro:  Nonfocal  Psych: Normal affect   Labs    High Sensitivity Troponin:   Recent Labs  Lab 11/19/18 0818 11/19/18 1026 11/19/18 1647 11/19/18 1847  TROPONINIHS 3,372* 3,796* 3,101* 3,094*      Chemistry Recent Labs  Lab 11/19/18 0818  NA 134*  K 3.7  CL 97*  CO2 25  GLUCOSE 105*  BUN 10  CREATININE 0.50  CALCIUM 8.9  PROT 6.6  ALBUMIN 3.9  AST 57*  ALT 16  ALKPHOS 84  BILITOT 0.9  GFRNONAA >60  GFRAA >60  ANIONGAP 12     Hematology Recent Labs  Lab 11/19/18 0818 11/20/18 0306  WBC 8.6 8.5  RBC 4.19 3.89  HGB 12.6 11.5*  HCT 37.8 34.8*  MCV 90.2 89.5  MCH 30.1 29.6  MCHC 33.3 33.0  RDW 13.8 13.5  PLT 260 244    BNPNo results for input(s): BNP, PROBNP in the last 168 hours.   DDimer No results for input(s): DDIMER in the last 168 hours.   Radiology    Ct Head Wo Contrast  Result Date: 11/19/2018 CLINICAL  DATA:  Confusion since yesterday. Left arm numbness since 3:30 a.m. today. EXAM: CT HEAD WITHOUT CONTRAST TECHNIQUE: Contiguous axial images were obtained from the base of the skull through the vertex without intravenous contrast. COMPARISON:  05/26/2013 FINDINGS: Brain: Moderately enlarged ventricles and subarachnoid spaces. Moderate to marked patchy white matter low density in both cerebral hemispheres. Vascular: No hyperdense vessel or unexpected calcification. Skull: Normal. Negative for fracture or focal lesion. Sinuses/Orbits: Status post bilateral cataract extraction. Mild right ethmoid sinus mucosal thickening. Other: None. IMPRESSION: 1. No acute abnormality. 2. Moderate to marked diffuse cerebral and cerebellar atrophy. 3. Moderate to marked chronic small vessel white matter ischemic changes in both cerebral hemispheres. 4. Mild chronic  right ethmoid sinusitis. Electronically Signed   By: Beckie Salts M.D.   On: 11/19/2018 08:25    Cardiac Studies   Echocardiogram as detailed above  Patient Profile     85 year old woman with no prior cardiac history, though she does have a long smoking history quit 2018, COPD, hypertension, hyperlipidemia presenting to the emergency room with confusion, numbness in her left hand   Assessment & Plan    1. NSTEMI Markedly elevated troponin on arrival greater than 3000 now trending downward -Echocardiogram results reviewed with her showing mid to distal anterior and anteroseptal wall with apical wall severe hypokinesis, possible akinesis,  concerning for ACS, ischemia, possible prior MI -His results were discussed with patient, patient's daughter at length, all questions answered -Options were provided including cardiac catheterization, stress testing at a later date, or medical management.  After discussion the risk and benefit of each procedure, recovery, patient and family has decided to proceed with cardiac catheterization -She did eat breakfast today, procedure will be scheduled later for this afternoon I have reviewed the risks, indications, and alternatives to cardiac catheterization, possible angioplasty, and stenting with the patient. Risks include but are not limited to bleeding, infection, vascular injury, stroke, myocardial infection, arrhythmia, kidney injury, radiation-related injury in the case of prolonged fluoroscopy use, emergency cardiac surgery, and death. The patient understands the risks of serious complication is 1-2 in 1000 with diagnostic cardiac cath and 1-2% or less with angioplasty/stenting.   Confusion Noted to be confused by friends and family night before admission and morning of admission - left hand numbness CT scan head with significant small vessel disease, atrophy Unable to exclude small stroke -Seems to be back to baseline Aspirin, statin   Smoker/COPD Stable at the current time, no respiratory distress Stable  Marked diffuse cerebellar and cerebral atrophy CT scan head on arrival in the setting of confusion Moderate to marked chronic small vessel white matter ischemic disease both cerebellar hemispheres -Aspirin statin  Long conversation with patient concerning the above Also called the daughter [name] and discussed details above  Total encounter time more than 35 minutes  Greater than 50% was spent in counseling and coordination of care with the patient   For questions or updates, please contact [facility] Please consult [email or web address] for contact info under        Signed, Julien Nordmann, MD  11/20/2018, 12:27 PM

## 2018-11-21 DIAGNOSIS — R0602 Shortness of breath: Secondary | ICD-10-CM

## 2018-11-21 DIAGNOSIS — I214 Non-ST elevation (NSTEMI) myocardial infarction: Principal | ICD-10-CM

## 2018-11-21 LAB — BASIC METABOLIC PANEL
Anion gap: 11 (ref 5–15)
BUN: 11 mg/dL (ref 8–23)
CO2: 26 mmol/L (ref 22–32)
Calcium: 8.4 mg/dL — ABNORMAL LOW (ref 8.9–10.3)
Chloride: 97 mmol/L — ABNORMAL LOW (ref 98–111)
Creatinine, Ser: 0.61 mg/dL (ref 0.44–1.00)
GFR calc Af Amer: >60 mL/min (ref >60)
GFR calc non Af Amer: >60 mL/min (ref >60)
Glucose, Bld: 82 mg/dL (ref 70–99)
Potassium: 3.2 mmol/L — ABNORMAL LOW (ref 3.5–5.1)
Sodium: 134 mmol/L — ABNORMAL LOW (ref 135–145)

## 2018-11-21 LAB — MAGNESIUM: Magnesium: 2.3 mg/dL (ref 1.7–2.4)

## 2018-11-21 LAB — HEPARIN LEVEL (UNFRACTIONATED): Heparin Unfractionated: <0.1 IU/mL — ABNORMAL LOW (ref 0.30–0.70)

## 2018-11-21 MED ORDER — SPIRONOLACTONE 25 MG PO TABS: 25.0000 mg | ORAL_TABLET | Freq: Every day | ORAL | 0 refills | Status: DC

## 2018-11-21 MED ORDER — LOSARTAN POTASSIUM 25 MG PO TABS: 25.0000 mg | ORAL_TABLET | Freq: Every day | ORAL | 0 refills | Status: DC

## 2018-11-21 MED ORDER — POTASSIUM CHLORIDE 20 MEQ PO PACK
40.0000 meq | PACK | Freq: Once | ORAL | Status: AC
  Administered 2018-11-21: 13:00:00 40 meq via ORAL
  Filled 2018-11-21: qty 2

## 2018-11-21 MED ORDER — CARVEDILOL 3.125 MG PO TABS: 3.1250 mg | ORAL_TABLET | Freq: Two times a day (BID) | ORAL | 0 refills | Status: DC

## 2018-11-21 MED ORDER — NITROGLYCERIN 0.4 MG SL SUBL: 0.4000 mg | SUBLINGUAL_TABLET | SUBLINGUAL | 0 refills | Status: DC | PRN

## 2018-11-21 NOTE — Plan of Care (Signed)
  Problem: Clinical Measurements: Goal: Will remain free from infection Outcome: Progressing Goal: Diagnostic test results will improve Outcome: Progressing   Problem: Activity: Goal: Risk for activity intolerance will decrease Outcome: Progressing   Problem: Pain Managment: Goal: General experience of comfort will improve Outcome: Progressing   Problem: Skin Integrity: Goal: Risk for impaired skin integrity will decrease Outcome: Progressing

## 2018-11-21 NOTE — Progress Notes (Signed)
Patient alert and oriented, vss, no complaints of pain.  D/c telemetry and piv.  No questions. Will discharge when her ride gets here and will be escorted to medical mall via nursing staff.

## 2018-11-21 NOTE — Progress Notes (Signed)
Progress Note   Subjective   Doing well today, the patient denies CP or SOB.  No new concerns  Inpatient Medications    Scheduled Meds: . aspirin EC  81 mg Oral Daily  . atorvastatin  40 mg Oral q1800  . carvedilol  3.125 mg Oral BID WC  . feeding supplement (ENSURE ENLIVE)  237 mL Oral BID BM  . levothyroxine  75 mcg Oral Q0600  . LORazepam  0.5 mg Oral BID  . multivitamin with minerals  1 tablet Oral Daily  . pantoprazole  40 mg Oral Daily  . potassium chloride  40 mEq Oral Once  . sodium chloride flush  3 mL Intravenous Q12H  . sodium chloride flush  3 mL Intravenous Q12H  . timolol  1 drop Both Eyes Daily   Continuous Infusions: . sodium chloride     PRN Meds: sodium chloride, acetaminophen, acetaminophen, albuterol, bisacodyl, nitroGLYCERIN, ondansetron (ZOFRAN) IV, ondansetron (ZOFRAN) IV, senna, sodium chloride flush, zolpidem   Vital Signs    Vitals:   11/20/18 1938 11/21/18 0441 11/21/18 0500 11/21/18 0936  BP: 116/69 107/67  126/69  Pulse: 71 65  73  Resp: 16 16    Temp: 99 F (37.2 C) 98.3 F (36.8 C)    TempSrc: Oral Oral    SpO2: 93% 91%  (!) 89%  Weight:   47 kg   Height:        Intake/Output Summary (Last 24 hours) at 11/21/2018 1236 Last data filed at 11/21/2018 1024 Gross per 24 hour  Intake 350.84 ml  Output 600 ml  Net -249.16 ml   Filed Weights   11/19/18 0722 11/19/18 1643 11/21/18 0500  Weight: 46.3 kg 46.3 kg 47 kg    Telemetry    sinus - Personally Reviewed  Physical Exam   GEN- The patient is well appearing, alert and oriented x 3 today.   Head- normocephalic, atraumatic Eyes-  Sclera clear, conjunctiva pink Ears- hearing intact Oropharynx- clear Neck- supple, Lungs-  normal work of breathing Heart- Regular rate and rhythm  GI- soft, NT, ND, + BS Extremities- no clubbing, cyanosis, or edema  MS- no significant deformity or atrophy Skin- no rash or lesion Psych- euthymic mood, full affect Neuro- strength and  sensation are intact   Labs    Chemistry Recent Labs  Lab 11/19/18 0818 11/21/18 0643  NA 134* 134*  K 3.7 3.2*  CL 97* 97*  CO2 25 26  GLUCOSE 105* 82  BUN 10 11  CREATININE 0.50 0.61  CALCIUM 8.9 8.4*  PROT 6.6  --   ALBUMIN 3.9  --   AST 57*  --   ALT 16  --   ALKPHOS 84  --   BILITOT 0.9  --   GFRNONAA >60 >60  GFRAA >60 >60  ANIONGAP 12 11     Hematology Recent Labs  Lab 11/19/18 0818 11/20/18 0306  WBC 8.6 8.5  RBC 4.19 3.89  HGB 12.6 11.5*  HCT 37.8 34.8*  MCV 90.2 89.5  MCH 30.1 29.6  MCHC 33.3 33.0  RDW 13.8 13.5  PLT 260 244      Assessment & Plan    1.  Acute systolic dysfunction with NSTEMI Cath reveals no obstructive CAD this admission Nonischemic CM vs stress induced CM Coreg, losartan and spironolactone started  2. COPD Smoking cessation is advised  No further inpatient cardiology management planned Follow-up with Dr Mariah Milling in the office Cardiology to see as needed over the weekend  Thompson Grayer MD, [facility] 11/21/2018 12:36 PM

## 2018-11-21 NOTE — Discharge Summary (Signed)
[facility]   PATIENT NAME: Erica Perry    MR#:  [id number]  DATE OF BIRTH:  28-Nov-1924  DATE OF ADMISSION:  11/19/2018   ADMITTING PHYSICIAN: Shaune PollackQing Chen, MD  DATE OF DISCHARGE: 11/21/2018  PRIMARY CARE PHYSICIAN: Gracelyn NurseJohnston, John D, MD   ADMISSION DIAGNOSIS:  Confusion [R41.0] NSTEMI (non-ST elevation myocardial infarction) (HCC) [I21.4] ACS (acute coronary syndrome) (HCC) [I24.9] DISCHARGE DIAGNOSIS:  Active Problems:   ACS (acute coronary syndrome) (HCC)  SECONDARY DIAGNOSIS:   Past Medical History:  Diagnosis Date  . Compression fracture of thoracic vertebra (HCC)   . COPD (chronic obstructive pulmonary disease) (HCC)   . Depression   . Dysuria   . Female bladder prolapse   . Glaucoma   . Hyperlipidemia   . Hypertension   . Hypothyroidism   . IBS (irritable bowel syndrome)   . Incomplete emptying of bladder   . Urethral prolapse    HOSPITAL COURSE:  Chief complaint; numbness in left arm Confusion  History of presenting complaint; Erica PaulsBetty Perry  is a 85 y.o. female with a known history of COPD, hypertension, hyperlipidemia, hypothyroidism, urethral prolapse, compression fracture of thoracic vertebra who presented to the ED with complaints of complains of numbness of left arm and hand this morning.  She also also reported to have been confused.  Patient denied any chest pain.  Her troponin was elevated at 3372, EKG shows some T wave abnormality.  Heparin drip is started and patient admitted to medical service for further evaluation  Hospital course; Non-STEMI. Patient presented with left arm numbness but no chest pain.  Troponin level elevated greater than 3000.  Patient was managed with heparin drip.  Placed on aspirin and statins.  2D echocardiogram done during this admission revealed ejection fraction of 35 to 40%.The cavity size was normal. Left ventricular diastolic Doppler parameters are consistent with impaired  relaxation. Severe hypokinesis of the mid to  apical anterior wall, anteroseptal wall and apical region.  Patient was seen by cardiologist Dr. Mariah MillingGollan and subsequently had cardiac catheterization done which revealed no significant CAD.  Medical management recommended.  Appointment made to follow-up with cardiologist Dr. Mariah MillingGollan in 1 week.  Dilated cardiomyopathy Cardiac catheterization done with No significant CAD. Moderately depressed EF 35% Given no significant disease, wall motion concerning for stress cardiomyopathy/dilated cardiomyopathy.  Cardiologist recommended medical management.  No intervention needed.  Plan is to continue with Coreg 3.125 mg p.o. twice daily, losartan 25 daily, spironolactone 25 daily Outpatient follow-up in clinic Hypertension.   Continue Coreg. Hyperlipidemia. Continue statins.   COPD. Stable. Albuterol as needed. Hypothyroidism. Continue Synthyroid.  Disposition; patient clinically and hemodynamically stable.  Ready for discharge already prior to my arrival.  I called and updated patient's daughter this morning on treatment plans so far and plans for discharge.  All questions were answered and they agree with plan of care and discharge plans  DISCHARGE CONDITIONS:  Stable CONSULTS OBTAINED:  Treatment Team:  Antonieta IbaGollan, Timothy J, MD DRUG ALLERGIES:   Allergies  Allergen Reactions  . Meperidine And Related Other (See Comments)    Cannot remember  . Midazolam Hcl   . Sulfa Antibiotics   . Aleve [Naproxen Sodium] Rash  . Codeine Sulfate Rash   DISCHARGE MEDICATIONS:   Allergies as of 11/21/2018      Reactions   Meperidine And Related Other (See Comments)   Cannot remember   Midazolam Hcl    Sulfa Antibiotics    Aleve [naproxen Sodium]  Rash   Codeine Sulfate Rash      Medication List    STOP taking these medications   cephALEXin 500 MG capsule Commonly known as: KEFLEX   oxyCODONE 5 MG/5ML solution Commonly known as: ROXICODONE     TAKE  these medications   Acetaminophen 500 MG coapsule Take 250 mg by mouth daily as needed for pain.   aspirin 81 MG tablet Take 81 mg by mouth daily.   carvedilol 3.125 MG tablet Commonly known as: COREG Take 1 tablet (3.125 mg total) by mouth 2 (two) times daily with a meal.   dorzolamide-timolol 22.3-6.8 MG/ML ophthalmic solution Commonly known as: COSOPT 1 drop 2 (two) times daily.   fluticasone 50 MCG/ACT nasal spray Commonly known as: FLONASE Place into both nostrils daily.   levothyroxine 75 MCG tablet Commonly known as: SYNTHROID Take 75 mcg by mouth daily before breakfast.   LORazepam 0.5 MG tablet Commonly known as: ATIVAN Take 1 tablet by mouth 2 (two) times daily.   losartan 25 MG tablet Commonly known as: Cozaar Take 1 tablet (25 mg total) by mouth daily.   nitroGLYCERIN 0.4 MG SL tablet Commonly known as: NITROSTAT Place 1 tablet (0.4 mg total) under the tongue every 5 (five) minutes x 3 doses as needed for chest pain.   omeprazole 20 MG capsule Commonly known as: PRILOSEC Take 20 mg by mouth daily.   polyethylene glycol powder 17 GM/SCOOP powder Commonly known as: GLYCOLAX/MIRALAX 1/2 -1 capful once a day with water to prevent constipation.  May put powder in coffee or water   simvastatin 40 MG tablet Commonly known as: ZOCOR Take 40 mg by mouth daily.   spironolactone 25 MG tablet Commonly known as: Aldactone Take 1 tablet (25 mg total) by mouth daily.   timolol 0.5 % ophthalmic solution Commonly known as: TIMOPTIC Place 1 drop into both eyes daily.        DISCHARGE INSTRUCTIONS:   DIET:  Cardiac diet DISCHARGE CONDITION:  Stable ACTIVITY:  Activity as tolerated OXYGEN:  Home Oxygen: No.  Oxygen Delivery: room air DISCHARGE LOCATION:  home   If you experience worsening of your admission symptoms, develop shortness of breath, life threatening emergency, suicidal or homicidal thoughts you must seek medical attention immediately by  calling 911 or calling your MD immediately  if symptoms less severe.  You Must read complete instructions/literature along with all the possible adverse reactions/side effects for all the Medicines you take and that have been prescribed to you. Take any new Medicines after you have completely understood and accpet all the possible adverse reactions/side effects.   Please note  You were cared for by a hospitalist during your hospital stay. If you have any questions about your discharge medications or the care you received while you were in the hospital after you are discharged, you can call the unit and asked to speak with the hospitalist on call if the hospitalist that took care of you is not available. Once you are discharged, your primary care physician will handle any further medical issues. Please note that NO REFILLS for any discharge medications will be authorized once you are discharged, as it is imperative that you return to your primary care physician (or establish a relationship with a primary care physician if you do not have one) for your aftercare needs so that they can reassess your need for medications and monitor your lab values.    On the day of Discharge:  VITAL SIGNS:  Blood pressure 126/69,  pulse 73, temperature 98.3 F (36.8 C), temperature source Oral, resp. rate 16, height 5\' 4"  (1.626 m), weight 47 kg, SpO2 (!) 89 %. PHYSICAL EXAMINATION:  GENERAL:  85 y.o.-year-old patient lying in the bed with no acute distress.  EYES: Pupils equal, round, reactive to light and accommodation. No scleral icterus. Extraocular muscles intact.  HEENT: Head atraumatic, normocephalic. Oropharynx and nasopharynx clear.  NECK:  Supple, no jugular venous distention. No thyroid enlargement, no tenderness.  LUNGS: Normal breath sounds bilaterally, no wheezing, rales,rhonchi or crepitation. No use of accessory muscles of respiration.  CARDIOVASCULAR: S1, S2 normal. No murmurs, rubs, or gallops.   ABDOMEN: Soft, non-tender, non-distended. Bowel sounds present. No organomegaly or mass.  EXTREMITIES: No pedal edema, cyanosis, or clubbing.  NEUROLOGIC: Cranial nerves II through XII are intact. Muscle strength 5/5 in all extremities. Sensation intact. Gait not checked.  PSYCHIATRIC: The patient is alert and oriented x 3.  SKIN: No obvious rash, lesion, or ulcer.  DATA REVIEW:   CBC Recent Labs  Lab 11/20/18 0306  WBC 8.5  HGB 11.5*  HCT 34.8*  PLT 244    Chemistries  Recent Labs  Lab 11/19/18 0818 11/21/18 0643  NA 134* 134*  K 3.7 3.2*  CL 97* 97*  CO2 25 26  GLUCOSE 105* 82  BUN 10 11  CREATININE 0.50 0.61  CALCIUM 8.9 8.4*  MG  --  2.3  AST 57*  --   ALT 16  --   ALKPHOS 84  --   BILITOT 0.9  --      Microbiology Results  Results for orders placed or performed during the hospital encounter of 11/19/18  SARS Coronavirus 2 [facility] order, Performed in [facility] hospital lab) Nasopharyngeal Nasopharyngeal Swab     Status: None   Collection Time: 11/19/18 10:24 AM   Specimen: Nasopharyngeal Swab  Result Value Ref Range Status   SARS Coronavirus 2 NEGATIVE NEGATIVE Final    Comment: (NOTE) If result is NEGATIVE SARS-CoV-2 target nucleic acids are NOT DETECTED. The SARS-CoV-2 RNA is generally detectable in upper and lower  respiratory specimens during the acute phase of infection. The lowest  concentration of SARS-CoV-2 viral copies this assay can detect is 250  copies / mL. A negative result does not preclude SARS-CoV-2 infection  and should not be used as the sole basis for treatment or other  patient management decisions.  A negative result may occur with  improper specimen collection / handling, submission of specimen other  than nasopharyngeal swab, presence of viral mutation(s) within the  areas targeted by this assay, and inadequate number of viral copies  (<250 copies / mL). A negative result must be combined with clinical  observations, patient  history, and epidemiological information. If result is POSITIVE SARS-CoV-2 target nucleic acids are DETECTED. The SARS-CoV-2 RNA is generally detectable in upper and lower  respiratory specimens dur ing the acute phase of infection.  Positive  results are indicative of active infection with SARS-CoV-2.  Clinical  correlation with patient history and other diagnostic information is  necessary to determine patient infection status.  Positive results do  not rule out bacterial infection or co-infection with other viruses. If result is PRESUMPTIVE POSTIVE SARS-CoV-2 nucleic acids MAY BE PRESENT.   A presumptive positive result was obtained on the submitted specimen  and confirmed on repeat testing.  While 2019 novel coronavirus  (SARS-CoV-2) nucleic acids may be present in the submitted sample  additional confirmatory testing may be necessary for epidemiological  and / or clinical management purposes  to differentiate between  SARS-CoV-2 and other Sarbecovirus currently known to infect humans.  If clinically indicated additional testing with an alternate test  methodology [phone]([id number]) is advised. The SARS-CoV-2 RNA is generally  detectable in upper and lower respiratory sp ecimens during the acute  phase of infection. The expected result is Negative. Fact Sheet for Patients:  BoilerBrush.com.cyhttps://www.fda.gov/media/136312/download Fact Sheet for Healthcare Providers: https://pope.com/https://www.fda.gov/media/136313/download This test is not yet approved or cleared by the [place] FDA and has been authorized for detection and/or diagnosis of SARS-CoV-2 by FDA under an Emergency Use Authorization (EUA).  This EUA will remain in effect (meaning this test can be used) for the duration of the COVID-19 declaration under Section 564(b)(1) of the Act, 21 U.S.C. section 360bbb-3(b)(1), unless the authorization is terminated or revoked sooner. Performed at [facility], [address] [place], [place] [address]      RADIOLOGY:  No results found.   Management plans discussed with the patient, family and they are in agreement.  CODE STATUS: DNR   TOTAL TIME TAKING CARE OF THIS PATIENT: 37 minutes.    Winnell Bento M.D on 11/21/2018 at 10:21 AM  Between 7am to 6pm - Pager - [phone]  After 6pm go to [email or web address] - Social research officer, governmentpassword EPAS [facility]  [facility]  Office  [phone]  CC: Primary care physician; Gracelyn NurseJohnston, John D, MD   Note: This dictation was prepared with Dragon dictation along with smaller phrase technology. Any transcriptional errors that result from this process are unintentional.

## 2018-11-23 ENCOUNTER — Encounter: Payer: Self-pay | Admitting: Cardiovascular Disease

## 2018-11-23 ENCOUNTER — Telehealth: Payer: Self-pay | Admitting: Cardiovascular Disease

## 2018-11-23 NOTE — Telephone Encounter (Signed)
Pt c/o medication issue:  1. Name of Medication: coreg losartan or spironolactone   2. How are you currently taking this medication (dosage and times per day)? Started s/p 9/11 cath  3. Are you having a reaction (difficulty breathing--STAT)? Diarrhea  4. What is your medication issue? Patient not sure if recent diarrhea is from the new medications.  Patient advised to also call pcp office to let them know.  Patient scheduled for virtual visit for cath fu.  Patient denies issues with site besides " just bruising".  She requests we mail a heart healthy diet list.

## 2018-11-23 NOTE — Telephone Encounter (Signed)
Virtual Visit Pre-Appointment Phone Call  "(Name), I am calling you today to discuss your upcoming appointment. We are currently trying to limit exposure to the virus that causes COVID-19 by seeing patients at home rather than in the office."  1. "What is the BEST phone number to call the day of the visit?" - include this in appointment notes  2. Do you have or have access to (through a family member/friend) a smartphone with video capability that we can use for your visit?" a. If yes - list this number in appt notes as cell (if different from BEST phone #) and list the appointment type as a VIDEO visit in appointment notes b. If no - list the appointment type as a PHONE visit in appointment notes  3. Confirm consent - "In the setting of the current Covid19 crisis, you are scheduled for a (phone or video) visit with your provider on (date) at (time).  Just as we do with many in-office visits, in order for you to participate in this visit, we must obtain consent.  If you'd like, I can send this to your mychart (if signed up) or email for you to review.  Otherwise, I can obtain your verbal consent now.  All virtual visits are billed to your insurance company just like a normal visit would be.  By agreeing to a virtual visit, we'd like you to understand that the technology does not allow for your provider to perform an examination, and thus may limit your provider's ability to fully assess your condition. If your provider identifies any concerns that need to be evaluated in person, we will make arrangements to do so.  Finally, though the technology is pretty good, we cannot assure that it will always work on either your or our end, and in the setting of a video visit, we may have to convert it to a phone-only visit.  In either situation, we cannot ensure that we have a secure connection.  Are you willing to proceed?" STAFF: Did the patient verbally acknowledge consent to telehealth visit? Document  YES/NO here: yes  4. Advise patient to be prepared - "Two hours prior to your appointment, go ahead and check your blood pressure, pulse, oxygen saturation, and your weight (if you have the equipment to check those) and write them all down. When your visit starts, your provider will ask you for this information. If you have an Apple Watch or Kardia device, please plan to have heart rate information ready on the day of your appointment. Please have a pen and paper handy nearby the day of the visit as well."  5. Give patient instructions for MyChart download to smartphone OR Doximity/Doxy.me as below if video visit (depending on what platform provider is using)  6. Inform patient they will receive a phone call 15 minutes prior to their appointment time (may be from unknown caller ID) so they should be prepared to answer    TELEPHONE CALL NOTE  Palma Paulhamus has been deemed a candidate for a follow-up tele-health visit to limit community exposure during the Covid-19 pandemic. I spoke with the patient via phone to ensure availability of phone/video source, confirm preferred email & phone number, and discuss instructions and expectations.  I reminded Ahmaya Hornyak to be prepared with any vital sign and/or heart rhythm information that could potentially be obtained via home monitoring, at the time of her visit. I reminded Avaiah Gulick to expect a phone call prior to  her visit.  Clarisse Gouge 11/23/2018 9:42 AM   INSTRUCTIONS FOR DOWNLOADING THE MYCHART APP TO SMARTPHONE  - The patient must first make sure to have activated MyChart and know their login information - If Apple, go to [employer] and type in MyChart in the search bar and download the app. If Android, ask patient to go to [employer] and type in [place] in the search bar and download the app. The app is free but as with any other app downloads, their phone may require them to verify saved payment information or  Apple/Android password.  - The patient will need to then log into the app with their MyChart username and password, and select [facility] as their healthcare provider to link the account. When it is time for your visit, go to the MyChart app, find appointments, and click Begin Video Visit. Be sure to Select Allow for your device to access the Microphone and Camera for your visit. You will then be connected, and your provider will be with you shortly.  **If they have any issues connecting, or need assistance please contact MyChart service desk [phone])**  **If using a computer, in order to ensure the best quality for their visit they will need to use either of the following Internet Browsers: [employer], or Google Chrome**  IF USING DOXIMITY or DOXY.ME - The patient will receive a link just prior to their visit by text.     FULL LENGTH CONSENT FOR TELE-HEALTH VISIT   I hereby voluntarily request, consent and authorize [facility] and its employed or contracted physicians, physician assistants, nurse practitioners or other licensed health care professionals (the Practitioner), to provide me with telemedicine health care services (the Services") as deemed necessary by the treating Practitioner. I acknowledge and consent to receive the Services by the Practitioner via telemedicine. I understand that the telemedicine visit will involve communicating with the Practitioner through live audiovisual communication technology and the disclosure of certain medical information by electronic transmission. I acknowledge that I have been given the opportunity to request an in-person assessment or other available alternative prior to the telemedicine visit and am voluntarily participating in the telemedicine visit.  I understand that I have the right to withhold or withdraw my consent to the use of telemedicine in the course of my care at any time, without affecting my right to future care  or treatment, and that the Practitioner or I may terminate the telemedicine visit at any time. I understand that I have the right to inspect all information obtained and/or recorded in the course of the telemedicine visit and may receive copies of available information for a reasonable fee.  I understand that some of the potential risks of receiving the Services via telemedicine include:   Delay or interruption in medical evaluation due to technological equipment failure or disruption;  Information transmitted may not be sufficient (e.g. poor resolution of images) to allow for appropriate medical decision making by the Practitioner; and/or   In rare instances, security protocols could fail, causing a breach of personal health information.  Furthermore, I acknowledge that it is my responsibility to provide information about my medical history, conditions and care that is complete and accurate to the best of my ability. I acknowledge that Practitioner's advice, recommendations, and/or decision may be based on factors not within their control, such as incomplete or inaccurate data provided by me or distortions of diagnostic images or specimens that may result from electronic transmissions. I  understand that the practice of medicine is not an exact science and that Practitioner makes no warranties or guarantees regarding treatment outcomes. I acknowledge that I will receive a copy of this consent concurrently upon execution via email to the email address I last provided but may also request a printed copy by calling the office of [facility].    I understand that my insurance will be billed for this visit.   I have read or had this consent read to me.  I understand the contents of this consent, which adequately explains the benefits and risks of the Services being provided via telemedicine.   I have been provided ample opportunity to ask questions regarding this consent and the Services and have had  my questions answered to my satisfaction.  I give my informed consent for the services to be provided through the use of telemedicine in my medical care  By participating in this telemedicine visit I agree to the above.

## 2018-11-23 NOTE — Telephone Encounter (Signed)
Spoke with patient and she states that her PCP gave her recommendations with medication. She states that other than that she is doing good and only still has that bruising where she had her procedure but no changes in that. Advised to please call us back if her symptoms persist or worsen and she verbalized understanding with no further questions at this time.

## 2018-11-25 NOTE — Telephone Encounter (Signed)
Returned call to patient, she reports that she is feeling much better now than when she woke. Denies any SOB or nausea.   Pt feels like mornings have been rough but is improved at this time, she will continue to watch sx and will call back if any new or worsening changes occur.   Pt talkative at this time. She has appt with pcp tomorrow. I advised her to keep a record of daily sx to see if any need to be adjusted. Pt agreed.   Advised pt to call for any further questions or concerns.

## 2018-11-25 NOTE — Telephone Encounter (Signed)
Patient calling back in stating she woke up feeling nauseas and weak. States yesterday was good but this morning has been different, with no changes in routine. Patient would like to be called when able.

## 2018-11-27 ENCOUNTER — Ambulatory Visit: Payer: Medicare Other | Admitting: Family

## 2018-11-27 NOTE — Telephone Encounter (Signed)
Patient calling in this morning hyper, patient took some ativan and calmed down. Patient is now calling in stating that she is SOB and wants to know if that could be coming from the medications she may be on (patient mentioned Coreg). Patient states she has minor nausea and is not eating like she should be (loss of appetite) and has been dizzy upon standing. Patient would like to be called if able. Patient is one week out from card cath.

## 2018-11-27 NOTE — Telephone Encounter (Signed)
Spoke with patient and she states that she woke up a little hyper but is now feeling better since taking ativan. She was just worked up about what she could eat, medications, and any potential side effects. Reviewed that she should try to eat a low sodium diet and she then wanted to know if she could eat some sweets. Reviewed that she most certainly could have some cake for her upcoming Birthday. Discussed her medications and what they are for and after talking she was so very appreciative and reports it put her mind at ease. Confirmed upcoming virtual appointment with provider and encouraged her to review these concerns with him. She verbalized understanding of our conversation, agreement with plan, and had no further questions at this time.

## 2018-12-01 NOTE — Progress Notes (Signed)
Virtual Visit via Video Note   This visit type was conducted due to national recommendations for restrictions regarding the COVID-19 Pandemic (e.g. social distancing) in an effort to limit this patient's exposure and mitigate transmission in our community.  Due to her co-morbid illnesses, this patient is at least at moderate risk for complications without adequate follow up.  This format is felt to be most appropriate for this patient at this time.  All issues noted in this document were discussed and addressed.  A limited physical exam was performed with this format.  Please refer to the patient's chart for her consent to telehealth for [facility].   I connected with  Erica Perry on 12/02/18 by a video enabled telemedicine application and verified that I am speaking with the correct person using two identifiers. I discussed the limitations of evaluation and management by telemedicine. The patient expressed understanding and agreed to proceed.   Evaluation Performed:  Follow-up visit  Date:  12/02/2018   ID:  Erica Perry, DOB [birth year], MRN [id number]  Patient Location:  [address] [place] [id number]   Provider location:   [facility], [place] [facility]  PCP:  Gracelyn NurseJohnston, John D, MD  Cardiologist:  Fonnie MuGollan, [facility]   Chief Complaint:  Post hospital, nstemi   History of Present Illness:    Erica Perry is a 85 y.o. female who presents via audio/video conferencing for a telehealth visit today.   The patient does not symptoms concerning for COVID-19 infection (fever, chills, cough, or new SHORTNESS OF BREATH).   Patient has a past medical history of long smoking history quit 2018,  COPD,  hypertension,  hyperlipidemia presenting to the emergency room with confusion, numbness in her left hand NSTEMI, Cardiac cath Who presents for cardiomyopathy, nstemi  Presented to the hospital November 19, 2018  woke up , developed numbness  tingling in first 3 fingers.  This seems to have improved through the morning,  Daughter reported  very confused night before admission, friends initially contacted her reporting that her mother was not acting normal.  When she went to visit her, she was confused, tried to write a bank check with her fingers without a pen for example, reported other events that were also not normal.   Emergency room triage notes indicating numbness left arm, patient only reports numbness in her fingers more confused overnight  Work-up in the emergency room showing mild abnormalities on EKG, cardiac enzyme high-sensitivity troponin more than 3000 concerning for non-STEMI She was started on heparin infusion  It was felt she had significant risk factors including lifelong smoking presenting symptoms concerning for TIA /small stroke given confusion, numbness in her left hand -CT scan head with significant vessel disease though no large CVA Likely poor candidate for MRI given her age and may not change management -Family initially declined catheterization but cardiac enzymes continue to climb  Catheterization was performed Details below  CT scan head with significant Marked diffuse cerebellar and cerebral atrophy Moderate to marked chronic small vessel white matter ischemic disease both cerebellar hemispheres  Since discharge from the hospital Feels well, no sob 3 daughters, helping her but live out of [place]  Anxiety issue, lives alone Anxious about living alone, daughter is not visiting enough active at home   Prior CV studies:   The following studies were reviewed today:  Echo9/01/2019  1. The left ventricle has moderately reduced systolic function, with an ejection fraction of  35-40%. The cavity size was normal. Left ventricular diastolic Doppler parameters are consistent with impaired relaxation. Severe hypokinesis of the mid to  apical anterior wall, anteroseptal wall and apical region.   2. The right ventricle has normal systolic function. The cavity was normal. There is no increase in right ventricular wall thickness. Right ventricular systolic pressure is moderately elevated with an estimated pressure of 48.3 mmHg.  3. Left atrial size was moderately dilated.  4. Mitral valve regurgitation is moderate  5. Tricuspid valve regurgitation is moderate.  6. Moderate calcification of the aortic valve. Aortic valve regurgitation is mild  Cardiac cath No significant CAD Moderately depressed EF 35% Given no significant disease, wall motion concerning for stress cardiomyopathy/dilated cardiomyopathy  -Coreg 3.125 mg p.o. twice daily, losartan 25 daily, spironolactone 25 daily   Past Medical History:  Diagnosis Date  . Compression fracture of thoracic vertebra (HCC)   . COPD (chronic obstructive pulmonary disease) ([place])   . Depression   . Dysuria   . Female bladder prolapse   . Glaucoma   . Hyperlipidemia   . Hypertension   . Hypothyroidism   . IBS (irritable bowel syndrome)   . Incomplete emptying of bladder   . Urethral prolapse    Past Surgical History:  Procedure Laterality Date  . LEFT HEART CATH AND CORONARY ANGIOGRAPHY N/A 11/20/2018   Procedure: LEFT HEART CATH AND CORONARY ANGIOGRAPHY;  Surgeon: Minna Merritts, MD;  Location: [facility];  Service: Cardiovascular;  Laterality: N/A;  . SHOULDER SURGERY        Allergies:   Meperidine and related, Midazolam hcl, Sulfa antibiotics, Aleve [naproxen sodium], and Codeine sulfate   Social History   Tobacco Use  . Smoking status: Former Smoker    Packs/day: 0.50    Years: 30.00    Pack years: 15.00    Types: Cigarettes    Quit date: 08/09/2013    Years since quitting: 5.3  . Smokeless tobacco: Never Used  Substance Use Topics  . Alcohol use: No    Alcohol/week: 0.0 standard drinks  . Drug use: Not on file     Current Outpatient Medications on File Prior to Visit  Medication Sig Dispense Refill   . Acetaminophen 500 MG coapsule Take 250 mg by mouth daily as needed for pain.    Marland Kitchen aspirin 81 MG tablet Take 81 mg by mouth daily.    . carvedilol (COREG) 3.125 MG tablet Take 1 tablet (3.125 mg total) by mouth 2 (two) times daily with a meal. 60 tablet 0  . dorzolamide-timolol (COSOPT) 22.3-6.8 MG/ML ophthalmic solution 1 drop 2 (two) times daily.    . fluticasone (FLONASE) 50 MCG/ACT nasal spray Place 1 spray into both nostrils daily.     Marland Kitchen levothyroxine (SYNTHROID, LEVOTHROID) 75 MCG tablet Take 75 mcg by mouth daily before breakfast.    . LORazepam (ATIVAN) 0.5 MG tablet Take 1 tablet by mouth 2 (two) times daily.    Marland Kitchen losartan (COZAAR) 25 MG tablet Take 1 tablet (25 mg total) by mouth daily. 30 tablet 0  . nitroGLYCERIN (NITROSTAT) 0.4 MG SL tablet Place 1 tablet (0.4 mg total) under the tongue every 5 (five) minutes x 3 doses as needed for chest pain. 30 tablet 0  . polyethylene glycol powder (GLYCOLAX/MIRALAX) 17 GM/SCOOP powder 1/2 -1 capful once a day with water to prevent constipation.  May put powder in coffee or water 255 g 0  . simvastatin (ZOCOR) 40 MG tablet Take 40 mg by  mouth daily.    Marland Kitchen spironolactone (ALDACTONE) 25 MG tablet Take 1 tablet (25 mg total) by mouth daily. 30 tablet 0  . timolol (TIMOPTIC) 0.5 % ophthalmic solution Place 1 drop into both eyes daily.     No current facility-administered medications on file prior to visit.      Family Hx: The patient's family history includes Breast cancer (age of onset: 60) in an other family member. There is no history of Prostate cancer, Kidney cancer, or Bladder Cancer.  ROS:   Please see the history of present illness.    Review of Systems  Constitutional: Negative.   HENT: Negative.   Respiratory: Positive for shortness of breath.   Cardiovascular: Negative.   Gastrointestinal: Negative.   Musculoskeletal: Negative.   Neurological: Negative.   Psychiatric/Behavioral: Negative.   All other systems reviewed and are  negative.    Labs/Other Tests and Data Reviewed:    Recent Labs: 11/19/2018: ALT 16; TSH 4.399 11/20/2018: Hemoglobin 11.5; Platelets 244 11/21/2018: BUN 11; Creatinine, Ser 0.61; Magnesium 2.3; Potassium 3.2; Sodium 134   Recent Lipid Panel Lab Results  Component Value Date/Time   CHOL 125 11/20/2018 03:06 AM   TRIG 38 11/20/2018 03:06 AM   HDL 58 11/20/2018 03:06 AM   CHOLHDL 2.2 11/20/2018 03:06 AM   LDLCALC 59 11/20/2018 03:06 AM    Wt Readings from Last 3 Encounters:  12/02/18 103 lb (46.7 kg)  11/21/18 103 lb 11.2 oz (47 kg)  08/12/18 105 lb (47.6 kg)     Exam:    Vital Signs: Vital signs may also be detailed in the HPI BP 132/82   Pulse 77   Ht 5\' 4"  (1.626 m)   Wt 103 lb (46.7 kg)   BMI 17.68 kg/m   Wt Readings from Last 3 Encounters:  12/02/18 103 lb (46.7 kg)  11/21/18 103 lb 11.2 oz (47 kg)  08/12/18 105 lb (47.6 kg)   Temp Readings from Last 3 Encounters:  11/21/18 98.3 F (36.8 C) (Oral)  08/12/18 (!) 97.5 F (36.4 C) (Oral)  03/09/16 97.8 F (36.6 C) (Oral)   BP Readings from Last 3 Encounters:  12/02/18 132/82  11/21/18 126/69  08/12/18 (!) 160/88   Pulse Readings from Last 3 Encounters:  12/02/18 77  11/21/18 73  08/12/18 78     Well nourished, well developed female in no acute distress. Constitutional:  oriented to person, place, and time. No distress.    ASSESSMENT & PLAN:    Problem List Items Addressed This Visit    None    Visit Diagnoses    Dilated cardiomyopathy (HCC)    -  Primary   Non-ST elevation (NSTEMI) myocardial infarction (HCC)       Smoker       Cerebral atrophy (HCC)         1. NSTEMI Markedly elevated troponin in the hospital September 2020 -Echocardiogram results reviewed with her showing mid to distal anterior and anteroseptal wall with apical wall severe hypokinesis, possible akinesis,  concerning for ACS, ischemia, possible prior MI --Catheterization performed, nonischemic cardiomyopathy We will  continue current medications As follow-up with CHF clinic next week Patient will monitor blood pressure at home Close follow-up in clini  Confusion - left hand numbness on arrival to the hospital CT scan head with significant small vessel disease, atrophy Unable to exclude small stroke -Seems to be back to baseline Aspirin, statin  Smoker/COPD Stable at the current time, no respiratory distress Stable  Marked diffuse cerebellar  and cerebral atrophy CT scan head on arrival in the setting of confusion Moderate to marked chronic small vessel white matter ischemic disease both cerebellar hemispheres -Aspirin statin   COVID-19 Education: The signs and symptoms of COVID-19 were discussed with the patient and how to seek care for testing (follow up with PCP or arrange E-visit).  The importance of social distancing was discussed today.  Patient Risk:   After full review of this patients clinical status, I feel that they are at least moderate risk at this time.  Time:   Today, I have spent 40 minutes with the patient with telehealth technology discussing the cardiac and medical problems/diagnoses detailed above   Additional 10 min spent reviewing the chart prior to patient visit today   Medication Adjustments/Labs and Tests Ordered: Current medicines are reviewed at length with the patient today.  Concerns regarding medicines are outlined above.   Tests Ordered: No tests ordered   Medication Changes: No changes made   Disposition: Follow-up in 2 months   Signed, Julien Nordmann, MD  [facility] [address], [place], [place] [address]

## 2018-12-02 ENCOUNTER — Encounter: Payer: Self-pay | Admitting: Cardiovascular Disease

## 2018-12-02 ENCOUNTER — Telehealth (INDEPENDENT_AMBULATORY_CARE_PROVIDER_SITE_OTHER): Payer: Medicare Other | Admitting: Cardiovascular Disease

## 2018-12-02 VITALS — BP 132/82 | HR 77 | Ht 64.0 in | Wt 103.0 lb

## 2018-12-02 DIAGNOSIS — I420 Dilated cardiomyopathy: Secondary | ICD-10-CM

## 2018-12-02 DIAGNOSIS — I214 Non-ST elevation (NSTEMI) myocardial infarction: Secondary | ICD-10-CM | POA: Diagnosis not present

## 2018-12-02 DIAGNOSIS — G319 Degenerative disease of nervous system, unspecified: Secondary | ICD-10-CM

## 2018-12-02 DIAGNOSIS — F17200 Nicotine dependence, unspecified, uncomplicated: Secondary | ICD-10-CM

## 2018-12-02 NOTE — Patient Instructions (Signed)
Medication Instructions:  No changes  Needs refills    If you need a refill on your cardiac medications before your next appointment, please call your pharmacy.    Lab work: No new labs needed   If you have labs (blood work) drawn today and your tests are completely normal, you will receive your results only by: Marland Kitchen MyChart Message (if you have MyChart) OR . A paper copy in the mail If you have any lab test that is abnormal or we need to change your treatment, we will call you to review the results.   Testing/Procedures: No new testing needed   Follow-Up: At [facility], you and your health needs are our priority.  As part of our continuing mission to provide you with exceptional heart care, we have created designated Provider Care Teams.  These Care Teams include your primary Cardiologist (physician) and Advanced Practice Providers (APPs -  Physician Assistants and Nurse Practitioners) who all work together to provide you with the care you need, when you need it.  . You will need a follow up appointment in 2 months, prefer in clinic   . Providers on your designated Care Team:   . Murray Hodgkins, NP . Christell Faith, PA-C . Marrianne Mood, PA-C  Any Other Special Instructions Will Be Listed Below (If Applicable).  For educational health videos Log in to : [email or web address] Or : [email or web address], password : [email or web address]

## 2018-12-09 ENCOUNTER — Other Ambulatory Visit: Payer: Self-pay

## 2018-12-09 ENCOUNTER — Encounter: Payer: Self-pay | Admitting: Family

## 2018-12-09 ENCOUNTER — Ambulatory Visit: Payer: Medicare Other | Attending: Family | Admitting: Family

## 2018-12-09 VITALS — BP 152/82 | HR 66 | Resp 18 | Ht 64.0 in | Wt 99.5 lb

## 2018-12-09 DIAGNOSIS — H409 Unspecified glaucoma: Secondary | ICD-10-CM | POA: Diagnosis not present

## 2018-12-09 DIAGNOSIS — Z882 Allergy status to sulfonamides status: Secondary | ICD-10-CM | POA: Insufficient documentation

## 2018-12-09 DIAGNOSIS — Z79899 Other long term (current) drug therapy: Secondary | ICD-10-CM | POA: Insufficient documentation

## 2018-12-09 DIAGNOSIS — J449 Chronic obstructive pulmonary disease, unspecified: Secondary | ICD-10-CM | POA: Diagnosis not present

## 2018-12-09 DIAGNOSIS — E039 Hypothyroidism, unspecified: Secondary | ICD-10-CM | POA: Insufficient documentation

## 2018-12-09 DIAGNOSIS — Z7982 Long term (current) use of aspirin: Secondary | ICD-10-CM | POA: Insufficient documentation

## 2018-12-09 DIAGNOSIS — Z79890 Hormone replacement therapy (postmenopausal): Secondary | ICD-10-CM | POA: Insufficient documentation

## 2018-12-09 DIAGNOSIS — Z8249 Family history of ischemic heart disease and other diseases of the circulatory system: Secondary | ICD-10-CM | POA: Insufficient documentation

## 2018-12-09 DIAGNOSIS — I10 Essential (primary) hypertension: Secondary | ICD-10-CM

## 2018-12-09 DIAGNOSIS — I5020 Unspecified systolic (congestive) heart failure: Secondary | ICD-10-CM | POA: Diagnosis not present

## 2018-12-09 DIAGNOSIS — Z885 Allergy status to narcotic agent status: Secondary | ICD-10-CM | POA: Diagnosis not present

## 2018-12-09 DIAGNOSIS — I110 Hypertensive heart disease with heart failure: Secondary | ICD-10-CM | POA: Insufficient documentation

## 2018-12-09 DIAGNOSIS — I5022 Chronic systolic (congestive) heart failure: Secondary | ICD-10-CM

## 2018-12-09 DIAGNOSIS — Z886 Allergy status to analgesic agent status: Secondary | ICD-10-CM | POA: Diagnosis not present

## 2018-12-09 DIAGNOSIS — K589 Irritable bowel syndrome without diarrhea: Secondary | ICD-10-CM | POA: Insufficient documentation

## 2018-12-09 DIAGNOSIS — E785 Hyperlipidemia, unspecified: Secondary | ICD-10-CM | POA: Insufficient documentation

## 2018-12-09 DIAGNOSIS — Z87891 Personal history of nicotine dependence: Secondary | ICD-10-CM | POA: Insufficient documentation

## 2018-12-09 NOTE — Progress Notes (Signed)
Patient ID: Erica Perry, female    DOB: Jul 06, 1924, 85 y.o.   MRN: [id number]  HPI  Ms Mcnorton is a 85 year old female with a past medical history of dilated cardiomyopathy, hypertension, COPD, NSTEMI, hypothyroidism, and hyperlipidemia.  Echo reviewed from 11/20/2018 and showed an EF of 35-40% with impaired relaxation and severe hypokenesis of mid to apical anterior wall, anteroseptal wall, and apical region. Cardiac cath on 11/20/2018 showed no significant coronary artery disease, moderate depressed EF of 35%, and given no significant disease, wall motion concerning for stress cardiomyopathy/dilated cardiomyopathy.   She was admitted 11/19/2018 for NSTEMI, with initial troponin 3,372. Heparin drip was initiated, cardiology consulted, and heart catheterization performed. She was discharged after 2 days. She went to the ED 08/12/2018 for lower back pain. X-ray showed L3 compression fracture. She was treated and released.   She presents today for an initial visit with a chief complaint of chronic back pain, chronic abdominal pain, and chronic fatigue. She denies shortness of breath, chest pain, leg swelling, palpitations, dizziness, and light-headedness. She does not have a scale at home to weigh herself daily. She has been mindful of the amount of sodium she eats and looks at the nutrition label. She does not add salt to food and gets some meals through meals on wheels. She has a cup of water and coffee in the morning and then sips on water throughout the day. She walks around her apartment and to the mailbox on most days.   Past Medical History:  Diagnosis Date  . Compression fracture of thoracic vertebra (HCC)   . COPD (chronic obstructive pulmonary disease) (HCC)   . Depression   . Dysuria   . Female bladder prolapse   . Glaucoma   . Hyperlipidemia   . Hypertension   . Hypothyroidism   . IBS (irritable bowel syndrome)   . Incomplete emptying of bladder   . Urethral prolapse    Past  Surgical History:  Procedure Laterality Date  . LEFT HEART CATH AND CORONARY ANGIOGRAPHY N/A 11/20/2018   Procedure: LEFT HEART CATH AND CORONARY ANGIOGRAPHY;  Surgeon: Antonieta Iba, MD;  Location: [facility] INVASIVE CV LAB;  Service: Cardiovascular;  Laterality: N/A;  . SHOULDER SURGERY     Family History  Problem Relation Age of Onset  . Breast cancer Other 40  . Heart failure Mother   . Prostate cancer Neg Hx   . Kidney cancer Neg Hx   . Bladder Cancer Neg Hx    Social History   Tobacco Use  . Smoking status: Former Smoker    Packs/day: 0.50    Years: 30.00    Pack years: 15.00    Types: Cigarettes    Quit date: 08/09/2013    Years since quitting: 5.3  . Smokeless tobacco: Never Used  Substance Use Topics  . Alcohol use: No    Alcohol/week: 0.0 standard drinks   Allergies  Allergen Reactions  . Meperidine And Related Other (See Comments)    Cannot remember  . Midazolam Hcl   . Sulfa Antibiotics   . Aleve [Naproxen Sodium] Rash  . Codeine Sulfate Rash    Prior to Admission medications   Medication Sig Start Date End Date Taking? Authorizing Provider  Acetaminophen 500 MG coapsule Take 250 mg by mouth daily as needed for pain.   Yes [provider]  aspirin 81 MG tablet Take 81 mg by mouth daily.   Yes [provider]  carvedilol (COREG) 3.125 MG tablet Take  1 tablet (3.125 mg total) by mouth 2 (two) times daily with a meal. 11/21/18  Yes Ojie, Jude, MD  levothyroxine (SYNTHROID, LEVOTHROID) 75 MCG tablet Take 75 mcg by mouth daily before breakfast.   Yes [provider]  LORazepam (ATIVAN) 0.5 MG tablet Take 1 tablet by mouth 2 (two) times daily. 01/04/16  Yes [provider]  losartan (COZAAR) 25 MG tablet Take 1 tablet (25 mg total) by mouth daily. 11/21/18 11/21/19 Yes Ojie, Jude, MD  nitroGLYCERIN (NITROSTAT) 0.4 MG SL tablet Place 1 tablet (0.4 mg total) under the tongue every 5 (five) minutes x 3 doses as needed for chest pain.  11/21/18  Yes Ojie, Jude, MD  simvastatin (ZOCOR) 40 MG tablet Take 40 mg by mouth daily.   Yes [provider]  spironolactone (ALDACTONE) 25 MG tablet Take 1 tablet (25 mg total) by mouth daily. 11/21/18 11/21/19 Yes Ojie, Jude, MD  timolol (TIMOPTIC) 0.5 % ophthalmic solution Place 1 drop into both eyes daily. 11/17/18  Yes [provider]  dorzolamide-timolol (COSOPT) 22.3-6.8 MG/ML ophthalmic solution 1 drop 2 (two) times daily.    [provider]  fluticasone (FLONASE) 50 MCG/ACT nasal spray Place 1 spray into both nostrils daily.     [provider]  polyethylene glycol powder (GLYCOLAX/MIRALAX) 17 GM/SCOOP powder 1/2 -1 capful once a day with water to prevent constipation.  May put powder in coffee or water Patient not taking: Reported on 12/09/2018 08/12/18   Tommi Rumps, PA-C    Review of Systems  Constitutional: Positive for fatigue. Negative for appetite change.  HENT: Positive for rhinorrhea. Negative for congestion and sore throat.   Eyes: Negative.   Respiratory: Negative for shortness of breath.   Cardiovascular: Negative for chest pain, palpitations and leg swelling.  Gastrointestinal: Positive for abdominal pain ("had for a long time"). Negative for abdominal distention.  Genitourinary: Positive for frequency.  Musculoskeletal: Positive for back pain ("compression fracture").  Skin: Negative.   Neurological: Negative for dizziness, light-headedness and numbness.  Psychiatric/Behavioral: Negative for behavioral problems and sleep disturbance (1 pillow).   Vitals:   12/09/18 0932  BP: (!) 152/82  Pulse: 66  Resp: 18  SpO2: 97%   Filed Weights   12/09/18 0932  Weight: 99 lb 8 oz (45.1 kg)   Lab Results  Component Value Date   CREATININE 0.61 11/21/2018   CREATININE 0.50 11/19/2018   CREATININE 0.60 07/28/2017    Physical Exam Constitutional:      Appearance: Normal appearance.  HENT:     Head: Normocephalic and atraumatic.   Neck:     Musculoskeletal: Normal range of motion.  Cardiovascular:     Rate and Rhythm: Normal rate and regular rhythm.  Pulmonary:     Effort: Pulmonary effort is normal.     Breath sounds: Normal breath sounds. No wheezing, rhonchi or rales.  Abdominal:     General: Abdomen is flat.     Palpations: Abdomen is soft.     Tenderness: There is no abdominal tenderness.  Musculoskeletal:     Right lower leg: No edema.     Left lower leg: No edema.  Skin:    General: Skin is warm and dry.  Neurological:     General: No focal deficit present.     Mental Status: She is alert and oriented to person, place, and time.  Psychiatric:        Mood and Affect: Mood normal.        Behavior: Behavior  normal.    Assessment/Plan:  1: Heart failure with reduced ejection fraction- - NYHA class I - euvolemic today - Scale given to her. Educated on weighing daily and reminded to call for an overnight weight gain of >2 pounds or a weekly weight gain of >5 pounds - Educated on following 2,000mg  sodium diet. Low salt cook book given.  - Since she is class I HF, will not start entresto at this time - saw cardiology Rockey Situ) 12/02/2018 via telemedicine and has an appointment in November - had a flu shot September 2020  2: HTN- - BP slightly elevated today. She states she was nervous this morning coming to this appointment. - saw PCP Edwina Barth) 09/16/2018 - BMP reviewed from 11/26/2018 and showed sodium 132, potassium 4.9, creatinine 0.5, GFR 116   Medication list reviewed.   Follow-up in 3 months or sooner for any questions or problems.

## 2018-12-09 NOTE — Patient Instructions (Signed)
Start weighing daily and call for an overnight weight gain of > 2 pounds or a weekly weight gain of >5 pounds.  

## 2018-12-14 ENCOUNTER — Telehealth: Payer: Self-pay | Admitting: Cardiovascular Disease

## 2018-12-14 NOTE — Telephone Encounter (Signed)
Patient daughter Erica Perry calling States that patient is confused on what she is being seen for Patient would like clarification on diagnosis and why she is seen at [facility] Please call to discuss at [phone]

## 2018-12-14 NOTE — Telephone Encounter (Signed)
I spoke to the patient's daughter [name]) and gave her an explanation as to why the patient has a f/u with Ignacia Bayley in November along with a visit to the CHF clinic in December.  She verbalized understanding.

## 2018-12-17 ENCOUNTER — Other Ambulatory Visit: Payer: Self-pay

## 2018-12-17 NOTE — Telephone Encounter (Signed)
*  STAT* If patient is at the pharmacy, call can be transferred to refill team.   1. Which medications need to be refilled? (please list name of each medication and dose if known) Coreg, Losartan, Spironolactone  2. Which pharmacy/location (including street and city if local pharmacy) is medication to be sent to? [facility]  3. Do they need a 30 day or 90 day supply? [place]

## 2018-12-18 MED ORDER — SPIRONOLACTONE 25 MG PO TABS: 25.0000 mg | ORAL_TABLET | Freq: Every day | ORAL | 5 refills | Status: DC

## 2018-12-18 MED ORDER — CARVEDILOL 3.125 MG PO TABS: 3.1250 mg | ORAL_TABLET | Freq: Two times a day (BID) | ORAL | 5 refills | Status: DC

## 2018-12-18 MED ORDER — LOSARTAN POTASSIUM 25 MG PO TABS: 25.0000 mg | ORAL_TABLET | Freq: Every day | ORAL | 5 refills | Status: DC

## 2019-02-02 ENCOUNTER — Ambulatory Visit: Payer: Medicare Other | Admitting: Nurse Practitioner

## 2019-02-10 NOTE — Progress Notes (Signed)
Cardiology Office Note    Date:  02/16/2019   ID:  Erica Perry, DOB 03/13/1924, MRN [id number]  PCP:  Erica NurseJohnston, John D, MD  Cardiologist:  Erica Nordmannimothy Gollan, MD  Electrophysiologist:  None   Chief Complaint: Follow up  History of Present Illness:   Erica Perry is a 85 y.o. female with history of nonobstructive CAD by LHC in 11/2018 as outlined below, HFrEF secondary to stress-induced cardiomyopathy/NICM, COPD secondary to long history of tobacco use quitting in 2018, HTN, HLD, and anxiety who presents for follow-up of her cardiomyopathy.  She was admitted to the hospital in 11/2018 with confusion and numbness/tingling in the first 3 digits of her left hand.  CT of the head showed no acute abnormality moderate to marked chronic small vessel ischemic changes.  MRI of the brain was not pursued given the patient's advanced age.  High-sensitivity troponin was checked and peaked at 3796.  Echo showed an EF of 35 to 40%, diastolic dysfunction, severe hypokinesis of the mid and apical anterior wall, anteroseptal wall, and apical region, normal RV systolic function and cavity size, PASP 48.3 mmHg, moderately dilated left atrium, moderate mitral regurgitation, moderate tricuspid regurgitation, mild aortic valve insufficiency.  LHC on 11/20/2018 showed no significant CAD.  Given no significant disease noted on cath, wall motion abnormalities and cardiomyopathy were favored to possibly be stress induced.  She was seen virtually by primary cardiologist in late 11/2018 and was doing reasonably well.  She comes in doing very well from a cardiac perspective.  She denies any chest pain, shortness of breath, palpitations, dizziness, presyncope, syncope, lower extremity swelling, abdominal distention, orthopnea, or early satiety.  Her weight remains stable running between 97 and 98 pounds by her home scale.  She is tolerating aspirin, carvedilol, losartan, simvastatin, and spironolactone without issues.  Her  meals come from [employer] on Whe[employer].  She denies eating a diet high in sodium or fat.  She has children that live in the [address] [place] [place].  She has close friends that live nearby that help her out as well.  She denies any falls, BRBPR, or melena.  No further confusion.   Labs: 11/2018 - magnesium 2.3, potassium 3.2, BUN 11, serum creatinine 0.61, total cholesterol 125, triglyceride 38, HDL 58, LDL 59, Hgb 11.5, PLT 244, TSH normal, A1c 5.9, albumin 3.9, ALT 16  Past Medical History:  Diagnosis Date  . Compression fracture of thoracic vertebra (HCC)   . COPD (chronic obstructive pulmonary disease) (HCC)   . Depression   . Dysuria   . Female bladder prolapse   . Glaucoma   . Hyperlipidemia   . Hypertension   . Hypothyroidism   . IBS (irritable bowel syndrome)   . Incomplete emptying of bladder   . Urethral prolapse     Past Surgical History:  Procedure Laterality Date  . CARDIAC CATHETERIZATION    . LEFT HEART CATH AND CORONARY ANGIOGRAPHY N/A 11/20/2018   Procedure: LEFT HEART CATH AND CORONARY ANGIOGRAPHY;  Surgeon: Antonieta IbaGollan, Timothy J, MD;  Location: [facility] INVASIVE CV LAB;  Service: Cardiovascular;  Laterality: N/A;  . SHOULDER SURGERY      Current Medications: Current Meds  Medication Sig  . Acetaminophen 500 MG coapsule Take 250 mg by mouth daily as needed for pain.  Marland Kitchen. aspirin 81 MG tablet Take 81 mg by mouth daily.  . carvedilol (COREG) 3.125 MG tablet Take 1 tablet (3.125 mg total) by mouth 2 (two) times daily with a meal.  .  fluticasone (FLONASE) 50 MCG/ACT nasal spray Place 1 spray into both nostrils as needed.   Marland Kitchen levothyroxine (SYNTHROID, LEVOTHROID) 75 MCG tablet Take 75 mcg by mouth daily before breakfast.  . LORazepam (ATIVAN) 0.5 MG tablet Take 1 tablet by mouth 2 (two) times daily.  Marland Kitchen losartan (COZAAR) 25 MG tablet Take 1 tablet (25 mg total) by mouth daily.  . nitroGLYCERIN (NITROSTAT) 0.4 MG SL tablet Place 1 tablet (0.4 mg total) under the tongue every 5  (five) minutes x 3 doses as needed for chest pain.  . simvastatin (ZOCOR) 40 MG tablet Take 40 mg by mouth daily.  Marland Kitchen spironolactone (ALDACTONE) 25 MG tablet Take 1 tablet (25 mg total) by mouth daily.  . timolol (TIMOPTIC) 0.5 % ophthalmic solution Place 1 drop into both eyes daily.    Allergies:   Meperidine and related, Midazolam hcl, Sulfa antibiotics, Aleve [naproxen sodium], and Codeine sulfate   Social History   Socioeconomic History  . Marital status: Divorced    Spouse name: Not on file  . Number of children: Not on file  . Years of education: Not on file  . Highest education level: Not on file  Occupational History  . Not on file  Social Needs  . Financial resource strain: Not on file  . Food insecurity    Worry: Not on file    Inability: Not on file  . Transportation needs    Medical: Not on file    Non-medical: Not on file  Tobacco Use  . Smoking status: Former Smoker    Packs/day: 0.50    Years: 30.00    Pack years: 15.00    Types: Cigarettes    Quit date: 08/09/2013    Years since quitting: 5.5  . Smokeless tobacco: Never Used  Substance and Sexual Activity  . Alcohol use: No    Alcohol/week: 0.0 standard drinks  . Drug use: Never  . Sexual activity: Not on file  Lifestyle  . Physical activity    Days per week: Not on file    Minutes per session: Not on file  . Stress: Not on file  Relationships  . Social Musician on phone: Not on file    Gets together: Not on file    Attends religious service: Not on file    Active member of club or organization: Not on file    Attends meetings of clubs or organizations: Not on file    Relationship status: Not on file  Other Topics Concern  . Not on file  Social History Narrative  . Not on file     Family History:  The patient's family history includes Breast cancer (age of onset: 72) in an other family member; Heart failure in her mother. There is no history of Prostate cancer, Kidney cancer, or  Bladder Cancer.  ROS:   Review of Systems  Constitutional: Positive for malaise/fatigue. Negative for chills, diaphoresis, fever and weight loss.  HENT: Negative for congestion.   Eyes: Negative for discharge and redness.  Respiratory: Negative for cough, hemoptysis, sputum production, shortness of breath and wheezing.   Cardiovascular: Negative for chest pain, palpitations, orthopnea, claudication, leg swelling and PND.  Gastrointestinal: Negative for abdominal pain, blood in stool, heartburn, melena, nausea and vomiting.  Genitourinary: Negative for hematuria.  Musculoskeletal: Negative for falls and myalgias.  Skin: Negative for rash.  Neurological: Negative for dizziness, tingling, tremors, sensory change, speech change, focal weakness, loss of consciousness and weakness.  Endo/Heme/Allergies: Does not  bruise/bleed easily.  Psychiatric/Behavioral: Negative for substance abuse. The patient is not nervous/anxious.   All other systems reviewed and are negative.    EKGs/Labs/Other Studies Reviewed:    Studies reviewed were summarized above. The additional studies were reviewed today:  LHC 11/2018: Coronary dominance: Right  Left mainstem:   Large vessel that bifurcates into the LAD and left circumflex, no significant disease noted  Left anterior descending (LAD):   Large vessel that extends to the apical region, diagonal branch 2 of moderate size, no significant disease noted, mild lumenal irregularities  Left circumflex (LCx):  Large vessel with OM branch 2, no significant disease noted  Right coronary artery (RCA):  Right dominant vessel with PL and PDA, no significant disease noted, mild lumenal irregularitis  Left ventriculography: Left ventricular systolic function is moderately depressed, , LVEF is estimated at 35%, there is no significant mitral regurgitation , no significant aortic valve stenosis  Final Conclusions:   No significant CAD Moderately depressed EF 35%  Given no significant disease, wall motion concerning for stress cardiomyopathy/dilated cardiomyopathy  Recommendations:  Medical management, no intervention needed Coreg 3.125 mg p.o. twice daily, losartan 25 daily, spironolactone 25 daily Outpatient follow-up in clinic __________  2D Echo 11/2018: 1. The left ventricle has moderately reduced systolic function, with an ejection fraction of 35-40%. The cavity size was normal. Left ventricular diastolic Doppler parameters are consistent with impaired relaxation. Severe hypokinesis of the mid to  apical anterior wall, anteroseptal wall and apical region.  2. The right ventricle has normal systolic function. The cavity was normal. There is no increase in right ventricular wall thickness. Right ventricular systolic pressure is moderately elevated with an estimated pressure of 48.3 mmHg.  3. Left atrial size was moderately dilated.  4. Mitral valve regurgitation is moderate  5. Tricuspid valve regurgitation is moderate.  6. Moderate calcification of the aortic valve. Aortic valve regurgitation is mild.   EKG:  EKG is ordered today.  The EKG ordered today demonstrates NSR, 63 bpm, no acute ST-T changes  Recent Labs: 11/19/2018: ALT 16; TSH 4.399 11/20/2018: Hemoglobin 11.5; Platelets 244 11/21/2018: BUN 11; Creatinine, Ser 0.61; Magnesium 2.3; Potassium 3.2; Sodium 134  Recent Lipid Panel    Component Value Date/Time   CHOL 125 11/20/2018 0306   TRIG 38 11/20/2018 0306   HDL 58 11/20/2018 0306   CHOLHDL 2.2 11/20/2018 0306   VLDL 8 11/20/2018 0306   LDLCALC 59 11/20/2018 0306    PHYSICAL EXAM:    VS:  BP (!) 158/90 (BP Location: Left Arm, Patient Position: Sitting, Cuff Size: Normal)   Pulse 63   Ht 5\' 4"  (1.626 m)   Wt 98 lb (44.5 kg)   BMI 16.82 kg/m   BMI: Body mass index is 16.82 kg/m.  Physical Exam  Constitutional: She is oriented to person, place, and time. She appears well-developed and well-nourished.  Frail and  elderly-appearing  HENT:  Head: Normocephalic and atraumatic.  Eyes: Right eye exhibits no discharge. Left eye exhibits no discharge.  Neck: Normal range of motion. No JVD present.  Cardiovascular: Normal rate, regular rhythm, S1 normal, S2 normal and normal heart sounds. Exam reveals no distant heart sounds, no friction rub, no midsystolic click and no opening snap.  No murmur heard. Pulses:      Posterior tibial pulses are 2+ on the right side and 2+ on the left side.  Pulmonary/Chest: Effort normal and breath sounds normal. No respiratory distress. She has no decreased breath sounds. She has no  wheezes. She has no rales. She exhibits no tenderness.  Abdominal: Soft. She exhibits no distension. There is no abdominal tenderness.  Musculoskeletal:        General: No edema.  Neurological: She is alert and oriented to person, place, and time.  Skin: Skin is warm and dry. No cyanosis. Nails show no clubbing.  Psychiatric: She has a normal mood and affect. Her speech is normal and behavior is normal. Judgment and thought content normal.    Wt Readings from Last 3 Encounters:  02/16/19 98 lb (44.5 kg)  12/09/18 99 lb 8 oz (45.1 kg)  12/02/18 103 lb (46.7 kg)     ASSESSMENT & PLAN:   1. HFrEF secondary to NICM versus stress-induced cardiomyopathy/pulmonary hypertension: She is doing remarkably well.  She appears euvolemic and well compensated.  Continue GDMT with carvedilol, losartan, and spironolactone.  Not on Entresto secondary to advanced age in an effort to minimize risk of significant hypotension/dizziness.  CHF education was discussed in detail.  Recommend obtaining a limited echo to evaluate for improvement in LV systolic function on evidence-based therapy.  Not requiring standing diuretic therapy.  Check BMP.  2. Nonobstructive CAD with recent non-STEMI: As outlined above on recent Perry in 11/2018.  Continue current medical therapy including aspirin, carvedilol, losartan, and statin.  I  presume she is not on dual antiplatelet therapy for medical management of her non-STEMI in the setting of her advanced age and frail state.  This is deferred to her primary cardiologist.  3. HTN: Blood pressure mildly elevated today though given her cardiomyopathy, advanced age, and frail state we will allow for a slightly permissive blood pressure at this time.  Continue current medications as outlined above.  4. HLD: LDL of 59 from 11/2018.  Remains on statin.  Disposition: F/u with Dr. Rockey Situ or an APP in 2 months.   Medication Adjustments/Labs and Tests Ordered: Current medicines are reviewed at length with the patient today.  Concerns regarding medicines are outlined above. Medication changes, Labs and Tests ordered today are summarized above and listed in the Patient Instructions accessible in Encounters.   Signed, Christell Faith, PA-C 02/16/2019 10:26 AM     [address] [place], [place] [address] [phone]

## 2019-02-16 ENCOUNTER — Other Ambulatory Visit: Payer: Self-pay

## 2019-02-16 ENCOUNTER — Ambulatory Visit (INDEPENDENT_AMBULATORY_CARE_PROVIDER_SITE_OTHER): Payer: Medicare Other | Admitting: Physician Assistant

## 2019-02-16 ENCOUNTER — Encounter: Payer: Self-pay | Admitting: Physician Assistant

## 2019-02-16 VITALS — BP 158/90 | HR 63 | Ht 64.0 in | Wt 98.0 lb

## 2019-02-16 DIAGNOSIS — I428 Other cardiomyopathies: Secondary | ICD-10-CM | POA: Diagnosis not present

## 2019-02-16 DIAGNOSIS — E785 Hyperlipidemia, unspecified: Secondary | ICD-10-CM

## 2019-02-16 DIAGNOSIS — I10 Essential (primary) hypertension: Secondary | ICD-10-CM

## 2019-02-16 DIAGNOSIS — I2510 Atherosclerotic heart disease of native coronary artery without angina pectoris: Secondary | ICD-10-CM | POA: Diagnosis not present

## 2019-02-16 DIAGNOSIS — I272 Pulmonary hypertension, unspecified: Secondary | ICD-10-CM | POA: Diagnosis not present

## 2019-02-16 NOTE — Patient Instructions (Signed)
Medication Instructions:  Your physician recommends that you continue on your current medications as directed. Please refer to the Current Medication list given to you today.  *If you need a refill on your cardiac medications before your next appointment, please call your pharmacy*  Lab Work: Bmet today If you have labs (blood work) drawn today and your tests are completely normal, you will receive your results only by: Marland Kitchen MyChart Message (if you have MyChart) OR . A paper copy in the mail If you have any lab test that is abnormal or we need to change your treatment, we will call you to review the results.  Testing/Procedures: Your physician has requested that you have an echocardiogram. Echocardiography is a painless test that uses sound waves to create images of your heart. It provides your doctor with information about the size and shape of your heart and how well your heart's chambers and valves are working. This procedure takes approximately one hour. There are no restrictions for this procedure.    Follow-Up: At [facility], you and your health needs are our priority.  As part of our continuing mission to provide you with exceptional heart care, we have created designated Provider Care Teams.  These Care Teams include your primary Cardiologist (physician) and Advanced Practice Providers (APPs -  Physician Assistants and Nurse Practitioners) who all work together to provide you with the care you need, when you need it.  Your next appointment:   2 month(s)  The format for your next appointment:   In Person  Provider:    You may see Ida Rogue, MD or one of the following Advanced Practice Providers on your designated Care Team:    Murray Hodgkins, NP  Christell Faith, PA-C  Marrianne Mood, PA-C   Other Instructions N/A

## 2019-02-17 ENCOUNTER — Telehealth: Payer: Self-pay | Admitting: Physician Assistant

## 2019-02-17 ENCOUNTER — Telehealth: Payer: Self-pay

## 2019-02-17 LAB — BASIC METABOLIC PANEL
BUN/Creatinine Ratio: 14 (ref 12–28)
BUN: 10 mg/dL (ref 10–36)
CO2: 24 mmol/L (ref 20–29)
Calcium: 8.9 mg/dL (ref 8.7–10.3)
Chloride: 98 mmol/L (ref 96–106)
Creatinine, Ser: 0.72 mg/dL (ref 0.57–1.00)
GFR calc Af Amer: 84 mL/min/{1.73_m2} (ref >59)
GFR calc non Af Amer: 73 mL/min/{1.73_m2} (ref >59)
Glucose: 96 mg/dL (ref 65–99)
Potassium: 4.7 mmol/L (ref 3.5–5.2)
Sodium: 136 mmol/L (ref 134–144)

## 2019-02-17 NOTE — Telephone Encounter (Signed)
-----   Message from Rise Mu, [place] sent at 02/17/2019 11:23 AM EST ----- Kidney function normal.  Potassium normal.  No changes in medications or plan at this time.

## 2019-02-17 NOTE — Telephone Encounter (Signed)
I spoke with the patient. She states that she noticed a bruise to the site where she had her lab stick yesterday in the office. The bruise was noticed when she took the guaze off her arm.  I have advised her with her age and being on ASA, this can certainly lead to her ability to bruise more easily. The area is not red or hot to touch/ nor painful.  I advised her to try to elevate her upper extremity today when able. She could try adding a little pressure to the area as well.   She is advised to call back if the area becomes red/ hot/ painful to touch. Otherwise, the patient has been advised that symptoms should resolve over the next several days.

## 2019-02-17 NOTE — Telephone Encounter (Signed)
Call to patient to discuss lab results. Pt verbalized understanding and had no further questions at this time.   Advised pt to call for any further questions or concerns.  

## 2019-02-17 NOTE — Telephone Encounter (Signed)
Patient states where she had labs drawn yesterday she has a know and it is black and blue . Please call to discuss

## 2019-02-22 ENCOUNTER — Ambulatory Visit: Payer: Medicare Other | Admitting: Family

## 2019-03-18 ENCOUNTER — Ambulatory Visit (INDEPENDENT_AMBULATORY_CARE_PROVIDER_SITE_OTHER): Payer: Medicare Other

## 2019-03-18 ENCOUNTER — Other Ambulatory Visit: Payer: Self-pay

## 2019-03-18 DIAGNOSIS — I428 Other cardiomyopathies: Secondary | ICD-10-CM

## 2019-03-19 ENCOUNTER — Telehealth: Payer: Self-pay

## 2019-03-19 NOTE — Telephone Encounter (Signed)
Call to patient to discuss echo results. No further questions or orders at this time.  Advised pt to call for any further questions or concerns.   She will continue monitoring vs and follow up as planned.

## 2019-03-19 NOTE — Telephone Encounter (Signed)
-----   Message from Sondra Barges, PA-C sent at 03/19/2019  7:38 AM EST ----- Echo showed normal pump function, slightly stiffened heart, moderately elevated pressure in the vessel going to the lungs. Compared to prior echo, pump function has normalized. Continue current medications.   Optimal blood pressure and heart rate control are recommended.

## 2019-04-03 NOTE — Progress Notes (Signed)
Patient ID: Erica Perry, female    DOB: 06-16-1924, 86 y.o.   MRN: [id number]  HPI  Erica Perry is a 86 year old female with a past medical history of dilated cardiomyopathy, hypertension, COPD, NSTEMI, hypothyroidism, and hyperlipidemia.  Echo report from 03/18/19 reviewed and showed an EF of 50-55% along with mild TR and moderately elevated pulmonary artery systolic pressure.  Echo reviewed from 11/20/2018 and showed an EF of 35-40% with impaired relaxation and severe hypokenesis of mid to apical anterior wall, anteroseptal wall, and apical region. Cardiac cath on 11/20/2018 showed no significant coronary artery disease, moderate depressed EF of 35%, and given no significant disease, wall motion concerning for stress cardiomyopathy/dilated cardiomyopathy.   She was admitted 11/19/2018 for NSTEMI, with initial troponin 3,372. Heparin drip was initiated, cardiology consulted, and heart catheterization performed. She was discharged after 2 days.   She presents today for a follow-up visit with a chief complaint of minimal fatigue upon moderate exertion. She describes this as intermittent in nature having been present for several years with varying levels of severity. She has associated rhinorrhea and back pain along with this. She denies any difficulty sleeping, dizziness, abdominal distention, palpitations, pedal edema, chest pain, shortness of breath, cough or weight gain.   Does admit that she "always gets nervous" when going to medical appointments.   Past Medical History:  Diagnosis Date  . Compression fracture of thoracic vertebra (HCC)   . COPD (chronic obstructive pulmonary disease) ([facility])   . Depression   . Dysuria   . Female bladder prolapse   . Glaucoma   . Hyperlipidemia   . Hypertension   . Hypothyroidism   . IBS (irritable bowel syndrome)   . Incomplete emptying of bladder   . Urethral prolapse    Past Surgical History:  Procedure Laterality Date  . CARDIAC CATHETERIZATION    .  LEFT HEART CATH AND CORONARY ANGIOGRAPHY N/A 11/20/2018   Procedure: LEFT HEART CATH AND CORONARY ANGIOGRAPHY;  Surgeon: Minna Merritts, MD;  Location: [facility];  Service: Cardiovascular;  Laterality: N/A;  . SHOULDER SURGERY     Family History  Problem Relation Age of Onset  . Breast cancer Other 40  . Heart failure Mother   . Prostate cancer Neg Hx   . Kidney cancer Neg Hx   . Bladder Cancer Neg Hx    Social History   Tobacco Use  . Smoking status: Former Smoker    Packs/day: 0.50    Years: 30.00    Pack years: 15.00    Types: Cigarettes    Quit date: 08/09/2013    Years since quitting: 5.6  . Smokeless tobacco: Never Used  Substance Use Topics  . Alcohol use: No    Alcohol/week: 0.0 standard drinks   Allergies  Allergen Reactions  . Meperidine And Related Other (See Comments)    Cannot remember  . Midazolam Hcl   . Sulfa Antibiotics   . Aleve [Naproxen Sodium] Rash  . Codeine Sulfate Rash   Prior to Admission medications   Medication Sig Start Date End Date Taking? Authorizing Provider  Acetaminophen 500 MG coapsule Take 250 mg by mouth daily as needed for pain.   Yes [provider]  aspirin 81 MG tablet Take 81 mg by mouth daily.   Yes [provider]  carvedilol (COREG) 3.125 MG tablet Take 1 tablet (3.125 mg total) by mouth 2 (two) times daily with a meal. 12/18/18  Yes Gollan, Kathlene November, MD  levothyroxine (SYNTHROID, LEVOTHROID) 75 MCG tablet Take 75 mcg by mouth daily before breakfast.   Yes [provider]  LORazepam (ATIVAN) 0.5 MG tablet Take 1 tablet by mouth 2 (two) times daily. 01/04/16  Yes [provider]  losartan (COZAAR) 25 MG tablet Take 1 tablet (25 mg total) by mouth daily. 12/18/18 12/18/19 Yes Gollan, Tollie Pizza, MD  nitroGLYCERIN (NITROSTAT) 0.4 MG SL tablet Place 1 tablet (0.4 mg total) under the tongue every 5 (five) minutes x 3 doses as needed for chest pain. 11/21/18  Yes Ojie, Jude, MD  polyethylene  glycol powder (GLYCOLAX/MIRALAX) 17 GM/SCOOP powder 1/2 -1 capful once a day with water to prevent constipation.  May put powder in coffee or water 08/12/18  Yes Bridget Hartshorn L, PA-C  simvastatin (ZOCOR) 40 MG tablet Take 40 mg by mouth daily.   Yes [provider]  spironolactone (ALDACTONE) 25 MG tablet Take 1 tablet (25 mg total) by mouth daily. 12/18/18 12/18/19 Yes Gollan, Tollie Pizza, MD  timolol (TIMOPTIC) 0.5 % ophthalmic solution Place 1 drop into both eyes daily. 11/17/18  Yes [provider]     Review of Systems  Constitutional: Positive for fatigue. Negative for appetite change.  HENT: Positive for rhinorrhea. Negative for congestion and sore throat.   Eyes: Negative.   Respiratory: Negative for cough and shortness of breath.   Cardiovascular: Negative for chest pain, palpitations and leg swelling.  Gastrointestinal: Negative for abdominal distention and abdominal pain.  Genitourinary: Positive for frequency.  Musculoskeletal: Positive for arthralgias (shoulder pain) and back pain ("compression fracture").  Skin: Negative.   Neurological: Negative for dizziness, light-headedness and numbness.  Psychiatric/Behavioral: Negative for behavioral problems and sleep disturbance (1 pillow).   Vitals:   04/05/19 0951  BP: (!) 159/86  Pulse: 63  Resp: 16  SpO2: 94%  Weight: 98 lb 6.4 oz (44.6 kg)  Height: 5\' 4"  (1.626 m)   Wt Readings from Last 3 Encounters:  04/05/19 98 lb 6.4 oz (44.6 kg)  02/16/19 98 lb (44.5 kg)  12/09/18 99 lb 8 oz (45.1 kg)   Lab Results  Component Value Date   CREATININE 0.72 02/16/2019   CREATININE 0.61 11/21/2018   CREATININE 0.50 11/19/2018    Physical Exam Constitutional:      Appearance: Normal appearance.  HENT:     Head: Normocephalic and atraumatic.  Cardiovascular:     Rate and Rhythm: Normal rate and regular rhythm.  Pulmonary:     Effort: Pulmonary effort is normal.     Breath sounds: Normal breath sounds. No  wheezing, rhonchi or rales.  Abdominal:     General: Abdomen is flat.     Palpations: Abdomen is soft.     Tenderness: There is no abdominal tenderness.  Musculoskeletal:     Cervical back: Normal range of motion.     Right lower leg: No edema.     Left lower leg: No edema.  Skin:    General: Skin is warm and dry.  Neurological:     General: No focal deficit present.     Mental Status: She is alert and oriented to person, place, and time.  Psychiatric:        Mood and Affect: Mood normal.        Behavior: Behavior normal.    Assessment/Plan:  1: Heart failure with now preserved ejection fraction- - NYHA class II - euvolemic today - weighing daily and she was reminded to call for an overnight weight gain of >2  pounds or a weekly weight gain of >5 pounds - weight down 1 pound from last visit here ~ 4 months ago - reminded to closely follow a  2,000mg  sodium diet; says that she receives meals on wheels but that they don't taste salty to her.  - saw cardiology Alycia Rossetti) 02/16/2019 - reports receiving her flu and pneumonia vaccines for this season  2: HTN- - BP mildly elevated today but patient says she gets nervous when coming to appointments - saw PCP Letitia Libra) 01/18/2019 - BMP reviewed from 02/16/2019 and showed sodium 136, potassium 4.7, creatinine 0.72, GFR 73   Medication list reviewed.   Will not make a return appointment for patient at this time. Advised patient that she could call back at anytime to make another appointment. Patient was comfortable with this plan.

## 2019-04-05 ENCOUNTER — Encounter: Payer: Self-pay | Admitting: Family

## 2019-04-05 ENCOUNTER — Ambulatory Visit: Payer: Medicare Other | Attending: Family | Admitting: Family

## 2019-04-05 ENCOUNTER — Other Ambulatory Visit: Payer: Self-pay

## 2019-04-05 VITALS — BP 159/86 | HR 63 | Resp 16 | Ht 64.0 in | Wt 98.4 lb

## 2019-04-05 DIAGNOSIS — Z885 Allergy status to narcotic agent status: Secondary | ICD-10-CM | POA: Insufficient documentation

## 2019-04-05 DIAGNOSIS — I5032 Chronic diastolic (congestive) heart failure: Secondary | ICD-10-CM

## 2019-04-05 DIAGNOSIS — E039 Hypothyroidism, unspecified: Secondary | ICD-10-CM | POA: Insufficient documentation

## 2019-04-05 DIAGNOSIS — I10 Essential (primary) hypertension: Secondary | ICD-10-CM

## 2019-04-05 DIAGNOSIS — J449 Chronic obstructive pulmonary disease, unspecified: Secondary | ICD-10-CM | POA: Insufficient documentation

## 2019-04-05 DIAGNOSIS — Z8249 Family history of ischemic heart disease and other diseases of the circulatory system: Secondary | ICD-10-CM | POA: Insufficient documentation

## 2019-04-05 DIAGNOSIS — I5030 Unspecified diastolic (congestive) heart failure: Secondary | ICD-10-CM | POA: Insufficient documentation

## 2019-04-05 DIAGNOSIS — I252 Old myocardial infarction: Secondary | ICD-10-CM | POA: Diagnosis not present

## 2019-04-05 DIAGNOSIS — I420 Dilated cardiomyopathy: Secondary | ICD-10-CM | POA: Diagnosis not present

## 2019-04-05 DIAGNOSIS — Z882 Allergy status to sulfonamides status: Secondary | ICD-10-CM | POA: Insufficient documentation

## 2019-04-05 DIAGNOSIS — M549 Dorsalgia, unspecified: Secondary | ICD-10-CM | POA: Diagnosis not present

## 2019-04-05 DIAGNOSIS — I110 Hypertensive heart disease with heart failure: Secondary | ICD-10-CM | POA: Diagnosis not present

## 2019-04-05 DIAGNOSIS — Z79899 Other long term (current) drug therapy: Secondary | ICD-10-CM | POA: Insufficient documentation

## 2019-04-05 DIAGNOSIS — Z87891 Personal history of nicotine dependence: Secondary | ICD-10-CM | POA: Insufficient documentation

## 2019-04-05 DIAGNOSIS — E785 Hyperlipidemia, unspecified: Secondary | ICD-10-CM | POA: Insufficient documentation

## 2019-04-05 DIAGNOSIS — Z7982 Long term (current) use of aspirin: Secondary | ICD-10-CM | POA: Diagnosis not present

## 2019-04-05 DIAGNOSIS — Z886 Allergy status to analgesic agent status: Secondary | ICD-10-CM | POA: Diagnosis not present

## 2019-04-05 NOTE — Patient Instructions (Addendum)
Continue weighing daily and call for an overnight weight gain of > 2 pounds or a weekly weight gain of >5 pounds. ° ° °Call us in the future to schedule another appointment °

## 2019-04-15 ENCOUNTER — Ambulatory Visit: Payer: Medicare Other | Admitting: Internal Medicine

## 2019-04-17 NOTE — Progress Notes (Signed)
Date:  04/19/2019   ID:  Erica Perry, DOB Nov 16, 1924, MRN [id number]  Patient Location:  [address] [place] [address]   Provider location:   [facility], [place] office  PCP:  Gracelyn Nurse, MD  Cardiologist:  Hubbard Robinson [facility]  Chief Complaint  Patient presents with  . office visit    Pt 2 month f/u. No complaints. Meds verbally reviewed w/ pt.     History of Present Illness:    Erica Perry is a 86 y.o. female past medical history of long smoking history quit 2018,  COPD,  hypertension,  hyperlipidemia presenting to the emergency room with confusion, numbness in her left hand NSTEMI, Cardiac cath, No significant CAD stress cardiomyopathy/dilated cardiomyopathy Who presents for cardiomyopathy, nstemi  Daughter with covid She does not like to wear a mask  Otherwise doing well, active Hospital records reviewed from 2020  Overall feels well, Gets around at home Does ADLS Lives alone  EKG personally reviewed by myself on todays visit Shows NSR PVC rate 59 bpm, no ST or t wave changes  Previous records reviewed Cath 11/2018 No significant CAD Moderately depressed EF 35% Given no significant disease, wall motion concerning for stress cardiomyopathy/dilated cardiomyopathy  Echo 03/2019, reviewed today 1. Left ventricular ejection fraction, by visual estimation, is 50 to  55%. The left ventricle has normal function. There is no increased left  ventricular wall thickness. Challenging images, unable to exclude regional  wall motion abnormality. Grossly  normal.  2. Left ventricular diastolic parameters are consistent with Grade I  diastolic dysfunction (impaired relaxation).  3. Global right ventricle has normal systolc function.The right  ventricular size is normal. no increase in right ventricular wall  thickness.  7. Moderately elevated pulmonary artery systolic pressure.   Presented to the hospital November 19, 2018  woke up , developed numbness tingling in first 3 fingers.  This seems to have improved through the morning,  Daughter reported  very confused night before admission, friends initially contacted her reporting that her mother was not acting normal.  When she went to visit her, she was confused, tried to write a bank check with her fingers without a pen for example, reported other events that were also not normal.   Previous CT scan head with significant Marked diffuse cerebellar and cerebral atrophy Moderate to marked chronic small vessel white matter ischemic disease both cerebellar hemispheres   Past Medical History:  Diagnosis Date  . Compression fracture of thoracic vertebra (HCC)   . COPD (chronic obstructive pulmonary disease) (HCC)   . Depression   . Dysuria   . Female bladder prolapse   . Glaucoma   . Hyperlipidemia   . Hypertension   . Hypothyroidism   . IBS (irritable bowel syndrome)   . Incomplete emptying of bladder   . Urethral prolapse    Past Surgical History:  Procedure Laterality Date  . CARDIAC CATHETERIZATION    . LEFT HEART CATH AND CORONARY ANGIOGRAPHY N/A 11/20/2018   Procedure: LEFT HEART CATH AND CORONARY ANGIOGRAPHY;  Surgeon: Antonieta Iba, MD;  Location: [facility];  Service: Cardiovascular;  Laterality: N/A;  . SHOULDER SURGERY        Allergies:   Meperidine and related, Midazolam hcl, Sulfa antibiotics, Aleve [naproxen sodium], and Codeine sulfate   Social History   Tobacco Use  . Smoking status: Former Smoker    Packs/day: 0.50    Years: 30.00  Pack years: 15.00    Types: Cigarettes    Quit date: 08/09/2013    Years since quitting: 5.6  . Smokeless tobacco: Never Used  Substance Use Topics  . Alcohol use: No    Alcohol/week: 0.0 standard drinks  . Drug use: Never     Current Outpatient Medications on File Prior to Visit  Medication Sig Dispense Refill  . Acetaminophen 500 MG coapsule Take 250 mg by mouth daily as  needed for pain.    Marland Kitchen aspirin 81 MG tablet Take 81 mg by mouth daily.    . carvedilol (COREG) 3.125 MG tablet Take 1 tablet (3.125 mg total) by mouth 2 (two) times daily with a meal. 60 tablet 5  . levothyroxine (SYNTHROID, LEVOTHROID) 75 MCG tablet Take 75 mcg by mouth daily before breakfast.    . LORazepam (ATIVAN) 0.5 MG tablet Take 1 tablet by mouth 2 (two) times daily.    Marland Kitchen losartan (COZAAR) 25 MG tablet Take 1 tablet (25 mg total) by mouth daily. 30 tablet 5  . polyethylene glycol powder (GLYCOLAX/MIRALAX) 17 GM/SCOOP powder 1/2 -1 capful once a day with water to prevent constipation.  May put powder in coffee or water 255 g 0  . simvastatin (ZOCOR) 40 MG tablet Take 40 mg by mouth daily.    Marland Kitchen spironolactone (ALDACTONE) 25 MG tablet Take 1 tablet (25 mg total) by mouth daily. 30 tablet 5  . timolol (TIMOPTIC) 0.5 % ophthalmic solution Place 1 drop into both eyes daily.     No current facility-administered medications on file prior to visit.     Family Hx: The patient's family history includes Breast cancer (age of onset: 85) in an other family member; Heart failure in her mother. There is no history of Prostate cancer, Kidney cancer, or Bladder Cancer.  ROS:   Please see the history of present illness.    Review of Systems  Constitutional: Negative.   HENT: Negative.   Respiratory: Negative.   Cardiovascular: Negative.   Gastrointestinal: Negative.   Musculoskeletal: Negative.   Neurological: Negative.   Psychiatric/Behavioral: Negative.   All other systems reviewed and are negative.    Labs/Other Tests and Data Reviewed:    Recent Labs: 11/19/2018: ALT 16; TSH 4.399 11/20/2018: Hemoglobin 11.5; Platelets 244 11/21/2018: Magnesium 2.3 02/16/2019: BUN 10; Creatinine, Ser 0.72; Potassium 4.7; Sodium 136   Recent Lipid Panel Lab Results  Component Value Date/Time   CHOL 125 11/20/2018 03:06 AM   TRIG 38 11/20/2018 03:06 AM   HDL 58 11/20/2018 03:06 AM   CHOLHDL 2.2  11/20/2018 03:06 AM   LDLCALC 59 11/20/2018 03:06 AM    Wt Readings from Last 3 Encounters:  04/19/19 98 lb 8 oz (44.7 kg)  04/05/19 98 lb 6.4 oz (44.6 kg)  02/16/19 98 lb (44.5 kg)     Exam:    Vital Signs: Vital signs may also be detailed in the HPI BP (!) 144/82 (BP Location: Left Arm, Patient Position: Sitting, Cuff Size: Normal)   Pulse (!) 59   Ht 5\' 4"  (1.626 m)   Wt 98 lb 8 oz (44.7 kg)   SpO2 93%   BMI 16.91 kg/m    Constitutional:  oriented to person, place, and time. No distress.  HENT:  Head: Grossly normal Eyes:  no discharge. No scleral icterus.  Neck: No JVD, no carotid bruits  Cardiovascular: Regular rate and rhythm, no murmurs appreciated Pulmonary/Chest: Clear to auscultation bilaterally, no wheezes or rails Abdominal: Soft.  no distension.  no tenderness.  Musculoskeletal: Normal range of motion Neurological:  normal muscle tone. Coordination normal. No atrophy Skin: Skin warm and dry Psychiatric: normal affect, pleasant   ASSESSMENT & PLAN:    Problem List Items Addressed This Visit    None    Visit Diagnoses    Chronic systolic heart failure (HCC)    -  Primary   NICM (nonischemic cardiomyopathy) (HCC)       Pulmonary hypertension, unspecified (HCC)       Essential hypertension       Coronary artery disease, non-occlusive       Relevant Orders   EKG 12-Lead   Hyperlipidemia LDL goal <70         1. NSTEMI/cardiomyopathy Markedly elevated troponin in the hospital September 2020 --Catheterization performed, nonischemic cardiomyopathy Echo 03/2019: EF 50 to 55% Continue current meds  Smoker/COPD Quit 2018  Marked diffuse cerebellar and cerebral atrophy CT scan head on arrival in hospital 2020 in the setting of confusion Moderate to marked chronic small vessel white matter ischemic disease both cerebellar hemispheres -Aspirin statin   Total encounter time more than 25 minutes  Greater than 50% was spent in counseling and coordination  of care with the patient   Disposition: Follow-up in 12 months   Signed, Julien Nordmann, MD  [facility] [address], [place], [place] [address]

## 2019-04-19 ENCOUNTER — Encounter: Payer: Self-pay | Admitting: Cardiovascular Disease

## 2019-04-19 ENCOUNTER — Other Ambulatory Visit: Payer: Self-pay

## 2019-04-19 ENCOUNTER — Ambulatory Visit (INDEPENDENT_AMBULATORY_CARE_PROVIDER_SITE_OTHER): Payer: Medicare Other | Admitting: Cardiovascular Disease

## 2019-04-19 VITALS — BP 144/82 | HR 59 | Ht 64.0 in | Wt 98.5 lb

## 2019-04-19 DIAGNOSIS — I428 Other cardiomyopathies: Secondary | ICD-10-CM | POA: Diagnosis not present

## 2019-04-19 DIAGNOSIS — I10 Essential (primary) hypertension: Secondary | ICD-10-CM

## 2019-04-19 DIAGNOSIS — E785 Hyperlipidemia, unspecified: Secondary | ICD-10-CM

## 2019-04-19 DIAGNOSIS — I272 Pulmonary hypertension, unspecified: Secondary | ICD-10-CM

## 2019-04-19 DIAGNOSIS — I5022 Chronic systolic (congestive) heart failure: Secondary | ICD-10-CM

## 2019-04-19 DIAGNOSIS — I2510 Atherosclerotic heart disease of native coronary artery without angina pectoris: Secondary | ICD-10-CM

## 2019-04-19 NOTE — Patient Instructions (Signed)
Medication Instructions:  No changes  If you need a refill on your cardiac medications before your next appointment, please call your pharmacy.    Lab work: No new labs needed   If you have labs (blood work) drawn today and your tests are completely normal, you will receive your results only by: . MyChart Message (if you have MyChart) OR . A paper copy in the mail If you have any lab test that is abnormal or we need to change your treatment, we will call you to review the results.   Testing/Procedures: No new testing needed   Follow-Up: At [facility], you and your health needs are our priority.  As part of our continuing mission to provide you with exceptional heart care, we have created designated Provider Care Teams.  These Care Teams include your primary Cardiologist (physician) and Advanced Practice Providers (APPs -  Physician Assistants and Nurse Practitioners) who all work together to provide you with the care you need, when you need it.  . You will need a follow up appointment in 12 months  . Providers on your designated Care Team:   . Christopher Berge, NP . Ryan Dunn, PA-C . Jacquelyn Visser, PA-C  Any Other Special Instructions Will Be Listed Below (If Applicable).  COVID-19 Vaccine Information can be found at: [email or web address] For questions related to vaccine distribution or appointments, please email [email or web address] or call [phone].     

## 2019-12-07 ENCOUNTER — Other Ambulatory Visit: Payer: Self-pay | Admitting: Cardiovascular Disease

## 2020-06-05 NOTE — Progress Notes (Signed)
Date:  06/06/2020   ID:  Erica Perry, DOB 1924-07-31, MRN [id number]  Patient Location:  [address] [place] [address]   Provider location:   [facility], [place] office  PCP:  Gracelyn Nurse, MD  Cardiologist:  Hubbard Robinson [facility]  Chief Complaint  Patient presents with  . Follow-up    Annual follow up. Medications verbally reviewed with patient and caregiver.      History of Present Illness:    Erica Perry is a 87 y.o. female past medical history of long smoking history quit 2018,  COPD,  hypertension,  hyperlipidemia presenting to the emergency room with confusion, numbness in her left hand NSTEMI, Cardiac cath, No significant CAD stress cardiomyopathy/dilated cardiomyopathy Who presents for cardiomyopathy, nstemi  LOV 04/2019 Reports that she uses meals on wheels Friends and family go buy her groceries Otherwise she is fiercely independent, does her own cleaning, mopping, ADLs Continues to live alone [place] around yard for exercise, does not go on the street  Overall feels well, denies any chest pain, shortness of breath, no leg swelling, no PND orthopnea Feels she is doing fine on her current medications  Has not been in the hospital since 11/2018  Labs 12/2021with PMD Potassium 5.4, prior potassium elevated On aldactone 25 daily Sodium running low  EKG personally reviewed by myself on todays visit Shows NSR  rate 57 bpm, no ST or t wave changes  Previous records reviewed Cath 11/2018 No significant CAD Moderately depressed EF 35% Given no significant disease, wall motion concerning for stress cardiomyopathy/dilated cardiomyopathy  Echo 03/2019, reviewed today 1. Left ventricular ejection fraction, by visual estimation, is 50 to  55%. The left ventricle has normal function. There is no increased left  ventricular wall thickness. Challenging images, unable to exclude regional  wall motion abnormality. Grossly   normal.  2. Left ventricular diastolic parameters are consistent with Grade I  diastolic dysfunction (impaired relaxation).  3. Global right ventricle has normal systolc function.The right  ventricular size is normal. no increase in right ventricular wall  thickness.  7. Moderately elevated pulmonary artery systolic pressure.   Presented to the hospital November 19, 2018  woke up , developed numbness tingling in first 3 fingers.  This seems to have improved through the morning,  Daughter reported  very confused night before admission, friends initially contacted her reporting that her mother was not acting normal.  When she went to visit her, she was confused, tried to write a bank check with her fingers without a pen for example, reported other events that were also not normal.   Previous CT scan head with significant Marked diffuse cerebellar and cerebral atrophy Moderate to marked chronic small vessel white matter ischemic disease both cerebellar hemispheres   Past Medical History:  Diagnosis Date  . Compression fracture of thoracic vertebra (HCC)   . COPD (chronic obstructive pulmonary disease) (HCC)   . Depression   . Dysuria   . Female bladder prolapse   . Glaucoma   . Hyperlipidemia   . Hypertension   . Hypothyroidism   . IBS (irritable bowel syndrome)   . Incomplete emptying of bladder   . Urethral prolapse    Past Surgical History:  Procedure Laterality Date  . CARDIAC CATHETERIZATION    . LEFT HEART CATH AND CORONARY ANGIOGRAPHY N/A 11/20/2018   Procedure: LEFT HEART CATH AND CORONARY ANGIOGRAPHY;  Surgeon: Antonieta Iba, MD;  Location: [facility] INVASIVE  CV LAB;  Service: Cardiovascular;  Laterality: N/A;  . SHOULDER SURGERY        Allergies:   Meperidine and related, Midazolam hcl, Sulfa antibiotics, Aleve [naproxen sodium], and Codeine sulfate   Social History   Tobacco Use  . Smoking status: Former Smoker    Packs/day: 0.50    Years: 30.00    Pack  years: 15.00    Types: Cigarettes    Quit date: 08/09/2013    Years since quitting: 6.8  . Smokeless tobacco: Never Used  Vaping Use  . Vaping Use: Never used  Substance Use Topics  . Alcohol use: No    Alcohol/week: 0.0 standard drinks  . Drug use: Never     Current Outpatient Medications on File Prior to Visit  Medication Sig Dispense Refill  . Acetaminophen 500 MG coapsule Take 250 mg by mouth daily as needed for pain.    Marland Kitchen aspirin 81 MG tablet Take 81 mg by mouth daily.    . carvedilol (COREG) 3.125 MG tablet TAKE ONE TABLET BY MOUTH TWICE DAILY WITH A MEAL 180 tablet 2  . levothyroxine (SYNTHROID, LEVOTHROID) 75 MCG tablet Take 75 mcg by mouth daily before breakfast.    . LORazepam (ATIVAN) 0.5 MG tablet Take 1 tablet by mouth 2 (two) times daily.    Marland Kitchen losartan (COZAAR) 25 MG tablet TAKE 1 TABLET BY MOUTH DAILY 90 tablet 2  . polyethylene glycol powder (GLYCOLAX/MIRALAX) 17 GM/SCOOP powder 1/2 -1 capful once a day with water to prevent constipation.  May put powder in coffee or water 255 g 0  . simvastatin (ZOCOR) 40 MG tablet Take 40 mg by mouth daily.    Marland Kitchen spironolactone (ALDACTONE) 25 MG tablet TAKE 1 TABLET BY MOUTH DAILY 90 tablet 2  . timolol (TIMOPTIC) 0.5 % ophthalmic solution Place 1 drop into both eyes daily.     No current facility-administered medications on file prior to visit.     Family Hx: The patient's family history includes Breast cancer (age of onset: 32) in an other family member; Heart failure in her mother. There is no history of Prostate cancer, Kidney cancer, or Bladder Cancer.  ROS:   Please see the history of present illness.    Review of Systems  Constitutional: Negative.   HENT: Negative.   Respiratory: Negative.   Cardiovascular: Negative.   Gastrointestinal: Negative.   Musculoskeletal: Negative.   Neurological: Negative.   Psychiatric/Behavioral: Negative.   All other systems reviewed and are negative.    Labs/Other Tests and Data  Reviewed:    Recent Labs: No results found for requested labs within last 8760 hours.   Recent Lipid Panel Lab Results  Component Value Date/Time   CHOL 125 11/20/2018 03:06 AM   TRIG 38 11/20/2018 03:06 AM   HDL 58 11/20/2018 03:06 AM   CHOLHDL 2.2 11/20/2018 03:06 AM   LDLCALC 59 11/20/2018 03:06 AM    Wt Readings from Last 3 Encounters:  06/06/20 97 lb (44 kg)  04/19/19 98 lb 8 oz (44.7 kg)  04/05/19 98 lb 6.4 oz (44.6 kg)     Exam:    Vital Signs: Vital signs may also be detailed in the HPI BP 130/80 (BP Location: Left Arm, Patient Position: Sitting, Cuff Size: Normal)   Pulse (!) 57   Ht 5\' 4"  (1.626 m)   Wt 97 lb (44 kg)   BMI 16.65 kg/m    Constitutional:  oriented to person, place, and time. No distress.  Thin HENT:  Head: Grossly normal Eyes:  no discharge. No scleral icterus.  Neck: No JVD, no carotid bruits  Cardiovascular: Regular rate and rhythm, no murmurs appreciated Pulmonary/Chest: Clear to auscultation bilaterally, no wheezes or rails Abdominal: Soft.  no distension.  no tenderness.  Musculoskeletal: Normal range of motion Neurological:  normal muscle tone. Coordination normal. No atrophy Skin: Skin warm and dry Psychiatric: normal affect, pleasant  ASSESSMENT & PLAN:    Problem List Items Addressed This Visit   None   Visit Diagnoses    Chronic systolic heart failure (HCC)    -  Primary   Relevant Orders   EKG 12-Lead   NICM (nonischemic cardiomyopathy) (HCC)       Relevant Orders   EKG 12-Lead   Pulmonary hypertension, unspecified (HCC)       Essential hypertension       Coronary artery disease, non-occlusive       Hyperlipidemia LDL goal <70       Chronic diastolic heart failure (HCC)         1. NSTEMI/cardiomyopathy Markedly elevated troponin in the hospital September 2020 --Catheterization performed, nonischemic cardiomyopathy Echo 03/2019: EF 50 to 55% Potassium elevated, will hold spiroloactone She has repeat lab work with  primary care in several weeks time  Smoker/COPD Quit 2018 Breathing stable  Marked diffuse cerebellar and cerebral atrophy CT scan head on arrival in hospital 2020 in the setting of confusion Moderate to marked chronic small vessel white matter ischemic disease both cerebellar hemispheres -Aspirin statin Denies any TIA or CVA   Total encounter time more than 25 minutes  Greater than 50% was spent in counseling and coordination of care with the patient    Signed, Julien Nordmann, MD  [facility] [address], [place], [place] [address]

## 2020-06-06 ENCOUNTER — Ambulatory Visit (INDEPENDENT_AMBULATORY_CARE_PROVIDER_SITE_OTHER): Payer: Medicare Other | Admitting: Cardiovascular Disease

## 2020-06-06 ENCOUNTER — Other Ambulatory Visit: Payer: Self-pay

## 2020-06-06 ENCOUNTER — Encounter: Payer: Self-pay | Admitting: Cardiovascular Disease

## 2020-06-06 VITALS — BP 130/80 | HR 57 | Ht 64.0 in | Wt 97.0 lb

## 2020-06-06 DIAGNOSIS — I10 Essential (primary) hypertension: Secondary | ICD-10-CM

## 2020-06-06 DIAGNOSIS — I428 Other cardiomyopathies: Secondary | ICD-10-CM | POA: Diagnosis not present

## 2020-06-06 DIAGNOSIS — I5032 Chronic diastolic (congestive) heart failure: Secondary | ICD-10-CM

## 2020-06-06 DIAGNOSIS — I272 Pulmonary hypertension, unspecified: Secondary | ICD-10-CM | POA: Diagnosis not present

## 2020-06-06 DIAGNOSIS — I5022 Chronic systolic (congestive) heart failure: Secondary | ICD-10-CM | POA: Diagnosis not present

## 2020-06-06 DIAGNOSIS — I2510 Atherosclerotic heart disease of native coronary artery without angina pectoris: Secondary | ICD-10-CM

## 2020-06-06 DIAGNOSIS — E785 Hyperlipidemia, unspecified: Secondary | ICD-10-CM

## 2020-06-06 NOTE — Patient Instructions (Signed)
Medication Instructions:  Please stop the spionolactone  If you need a refill on your cardiac medications before your next appointment, please call your pharmacy.    Lab work: No new labs needed   If you have labs (blood work) drawn today and your tests are completely normal, you will receive your results only by: Marland Kitchen MyChart Message (if you have MyChart) OR . A paper copy in the mail If you have any lab test that is abnormal or we need to change your treatment, we will call you to review the results.   Testing/Procedures: No new testing needed   Follow-Up: At [facility], you and your health needs are our priority.  As part of our continuing mission to provide you with exceptional heart care, we have created designated Provider Care Teams.  These Care Teams include your primary Cardiologist (physician) and Advanced Practice Providers (APPs -  Physician Assistants and Nurse Practitioners) who all work together to provide you with the care you need, when you need it.  . You will need a follow up appointment in 12 months  . Providers on your designated Care Team:   . Nicolasa Ducking, NP . Eula Listen, PA-C . Marisue Ivan, PA-C  Any Other Special Instructions Will Be Listed Below (If Applicable).  COVID-19 Vaccine Information can be found at: [email or web address].[email or web address] For questions related to vaccine distribution or appointments, please email [email or web address] or call [phone].

## 2021-06-01 ENCOUNTER — Other Ambulatory Visit: Payer: Self-pay

## 2021-06-01 ENCOUNTER — Encounter: Payer: Self-pay | Admitting: Cardiovascular Disease

## 2021-06-01 ENCOUNTER — Ambulatory Visit (INDEPENDENT_AMBULATORY_CARE_PROVIDER_SITE_OTHER): Payer: Medicare Other | Admitting: Cardiovascular Disease

## 2021-06-01 VITALS — BP 138/82 | HR 63 | Ht 64.0 in | Wt 96.0 lb

## 2021-06-01 DIAGNOSIS — I10 Essential (primary) hypertension: Secondary | ICD-10-CM

## 2021-06-01 DIAGNOSIS — E785 Hyperlipidemia, unspecified: Secondary | ICD-10-CM

## 2021-06-01 DIAGNOSIS — I428 Other cardiomyopathies: Secondary | ICD-10-CM

## 2021-06-01 DIAGNOSIS — I2510 Atherosclerotic heart disease of native coronary artery without angina pectoris: Secondary | ICD-10-CM | POA: Diagnosis not present

## 2021-06-01 DIAGNOSIS — I5022 Chronic systolic (congestive) heart failure: Secondary | ICD-10-CM | POA: Diagnosis not present

## 2021-06-01 DIAGNOSIS — I272 Pulmonary hypertension, unspecified: Secondary | ICD-10-CM

## 2021-06-01 MED ORDER — LOSARTAN POTASSIUM 25 MG PO TABS: 25.0000 mg | ORAL_TABLET | Freq: Every day | ORAL | 3 refills | Status: DC

## 2021-06-01 MED ORDER — SIMVASTATIN 40 MG PO TABS: 40.0000 mg | ORAL_TABLET | Freq: Every day | ORAL | 3 refills | Status: AC

## 2021-06-01 MED ORDER — CARVEDILOL 3.125 MG PO TABS: 3.1250 mg | ORAL_TABLET | Freq: Two times a day (BID) | ORAL | 3 refills | Status: DC

## 2021-06-01 NOTE — Patient Instructions (Addendum)
Please check blood pressure at home, ?Call the office if >160 ? ? ?Medication Instructions:  ?No changes ? ?Try over the counter omeprazole for stomach ? ?If you need a refill on your cardiac medications before your next appointment, please call your pharmacy.  ? ?Lab work: ?No new labs needed ? ?Testing/Procedures: ?No new testing needed ? ?Follow-Up: ?At [facility], you and your health needs are our priority.  As part of our continuing mission to provide you with exceptional heart care, we have created designated Provider Care Teams.  These Care Teams include your primary Cardiologist (physician) and Advanced Practice Providers (APPs -  Physician Assistants and Nurse Practitioners) who all work together to provide you with the care you need, when you need it. ? ?You will need a follow up appointment in 12 months ? ?Providers on your designated Care Team:   ?Murray Hodgkins, NP ?Christell Faith, PA-C ?Cadence Kathlen Mody, PA-C ? ?COVID-19 Vaccine Information can be found at: [email or web address] For questions related to vaccine distribution or appointments, please email [email or web address] or call [phone].  ? ?

## 2021-06-01 NOTE — Progress Notes (Signed)
?  ?  ? ? ?Date:  06/01/2021  ? ?ID:  Erica Perry, DOB 05/31/1924, MRN [id number] ? ?Patient Location:  ?[address] ?[place] [address]  ? ?Provider location:   ?[facility], [facility] office ? ?PCP:  Gracelyn Nurse, MD  ?Cardiologist:  [facility] ? ?Chief Complaint  ?Patient presents with  ? 12 month follow up   ?  "Doing well." Medications reviewed by the patient verbally.   ? ? ?History of Present Illness:   ? ?Erica Perry is a 88 y.o. female past medical history of ?long smoking history quit 2018,  ?COPD,  ?hypertension,  ?hyperlipidemia presenting to the emergency room with confusion, numbness in her left hand ?NSTEMI, ?Cardiac cath, No significant CAD ?stress cardiomyopathy/dilated cardiomyopathy ?Who presents for cardiomyopathy, nstemi ? ?Last seen in clinic June 06, 2020 ? ?Left knee pain,seen by orthmo ?"I miss walking" ?Icing ?Did not tolerate mobic ? ?Does house work ?Meal on wheels ?Lives alone ? ?Not check pressure at home ?BP elevated today*on initial evaluation but improved on recheck at end of visit, pain, stress likely causing it to go higher on presentation ?Blood pressure was fine with PMD ? ?EKG personally reviewed by myself on todays visit ?NSR rate 63 bpm LAD ? ?Previous records reviewed ?Cath 11/2018 ?No significant CAD ?Moderately depressed EF 35% ?Given no significant disease, wall motion concerning for stress cardiomyopathy/dilated cardiomyopathy ? ?Echo 03/2019, reviewed today ? 1. Left ventricular ejection fraction, by visual estimation, is 50 to  ?55%. The left ventricle has normal function. There is no increased left  ?ventricular wall thickness. Challenging images, unable to exclude regional  wall motion abnormality. Grossly  normal.  ? 2. Left ventricular diastolic parameters are consistent with Grade I  ?diastolic dysfunction (impaired relaxation).  ? 3. Global right ventricle has normal systolc function.The right  ?ventricular size is normal. no  increase in right ventricular wall  ?thickness.  ? 7. Moderately elevated pulmonary artery systolic pressure.  ? ?Presented to the hospital November 19, 2018 ? woke up , developed numbness tingling in first 3 fingers.  This seems to have improved through the morning,  ?Daughter reported  very confused night before admission, friends initially contacted her reporting that her mother was not acting normal.  When she went to visit her, she was confused, tried to write a bank check with her fingers without a pen for example, reported other events that were also not normal.  ? ?Previous CT scan head with significant ?Marked diffuse cerebellar and cerebral atrophy ?Moderate to marked chronic small vessel white matter ischemic disease both cerebellar hemispheres ? ? ?Past Medical History:  ?Diagnosis Date  ? Compression fracture of thoracic vertebra (HCC)   ? COPD (chronic obstructive pulmonary disease) (HCC)   ? Depression   ? Dysuria   ? Female bladder prolapse   ? Glaucoma   ? Hyperlipidemia   ? Hypertension   ? Hypothyroidism   ? IBS (irritable bowel syndrome)   ? Incomplete emptying of bladder   ? Urethral prolapse   ? ?Past Surgical History:  ?Procedure Laterality Date  ? CARDIAC CATHETERIZATION    ? LEFT HEART CATH AND CORONARY ANGIOGRAPHY N/A 11/20/2018  ? Procedure: LEFT HEART CATH AND CORONARY ANGIOGRAPHY;  Surgeon: Antonieta Iba, MD;  Location: [facility] INVASIVE CV LAB;  Service: Cardiovascular;  Laterality: N/A;  ? SHOULDER SURGERY    ?  ? ? ?Allergies:   Meperidine and related, Midazolam hcl, Sulfa antibiotics, Aleve [  naproxen sodium], and Codeine sulfate  ? ?Social History  ? ?Tobacco Use  ? Smoking status: Former  ?  Packs/day: 0.50  ?  Years: 30.00  ?  Pack years: 15.00  ?  Types: Cigarettes  ?  Quit date: 08/09/2013  ?  Years since quitting: 7.8  ? Smokeless tobacco: Never  ?Vaping Use  ? Vaping Use: Never used  ?Substance Use Topics  ? Alcohol use: No  ?  Alcohol/week: 0.0 standard drinks  ? Drug use: Never   ?  ? ?Current Outpatient Medications on File Prior to Visit  ?Medication Sig Dispense Refill  ? Acetaminophen 500 MG coapsule Take 250 mg by mouth daily as needed for pain.    ? aspirin 81 MG tablet Take 81 mg by mouth daily.    ? levothyroxine (SYNTHROID, LEVOTHROID) 75 MCG tablet Take 75 mcg by mouth daily before breakfast.    ? LORazepam (ATIVAN) 0.5 MG tablet Take 1 tablet by mouth 2 (two) times daily.    ? polyethylene glycol powder (GLYCOLAX/MIRALAX) 17 GM/SCOOP powder 1/2 -1 capful once a day with water to prevent constipation.  May put powder in coffee or water 255 g 0  ? timolol (TIMOPTIC) 0.5 % ophthalmic solution Place 1 drop into both eyes daily.    ? ?No current facility-administered medications on file prior to visit.  ?  ? ?Family Hx: ?The patient's family history includes Breast cancer (age of onset: 58) in an other family member; Heart failure in her mother. There is no history of Prostate cancer, Kidney cancer, or Bladder Cancer. ? ?ROS:   ?Please see the history of present illness.    ?Review of Systems  ?Constitutional: Negative.   ?HENT: Negative.    ?Respiratory: Negative.    ?Cardiovascular: Negative.   ?Gastrointestinal: Negative.   ?Musculoskeletal: Negative.   ?Neurological: Negative.   ?Psychiatric/Behavioral: Negative.    ?All other systems reviewed and are negative.  ? ?Labs/Other Tests and Data Reviewed:   ? ?Recent Labs: ?No results found for requested labs within last 8760 hours.  ? ?Recent Lipid Panel ?Lab Results  ?Component Value Date/Time  ? CHOL 125 11/20/2018 03:06 AM  ? TRIG 38 11/20/2018 03:06 AM  ? HDL 58 11/20/2018 03:06 AM  ? CHOLHDL 2.2 11/20/2018 03:06 AM  ? LDLCALC 59 11/20/2018 03:06 AM  ? ? ?Wt Readings from Last 3 Encounters:  ?06/01/21 96 lb (43.5 kg)  ?06/06/20 97 lb (44 kg)  ?04/19/19 98 lb 8 oz (44.7 kg)  ?  ? ?Exam:   ? ?Vital Signs: Vital signs may also be detailed in the HPI ?BP (!) 180/90 (BP Location: Left Arm, Patient Position: Sitting, Cuff Size: Normal)    Pulse 63   Ht 5\' 4"  (1.626 m)   Wt 96 lb (43.5 kg)   BMI 16.48 kg/m?   ? ?Constitutional:  oriented to person, place, and time. No distress.  ?HENT:  ?Head: Grossly normal ?Eyes:  no discharge. No scleral icterus.  ?Neck: No JVD, no carotid bruits  ?Cardiovascular: Regular rate and rhythm, no murmurs appreciated ?Pulmonary/Chest: Clear to auscultation bilaterally, no wheezes or rails ?Abdominal: Soft.  no distension.  no tenderness.  ?Musculoskeletal: Normal range of motion ?Neurological:  normal muscle tone. Coordination normal. No atrophy ?Skin: Skin warm and dry ?Psychiatric: normal affect, pleasant ? ? ?ASSESSMENT & PLAN:   ? ?Problem List Items Addressed This Visit   ?None ?Visit Diagnoses   ? ? Chronic systolic heart failure (HCC)    -  Primary  ? Relevant Medications  ? carvedilol (COREG) 3.125 MG tablet  ? losartan (COZAAR) 25 MG tablet  ? simvastatin (ZOCOR) 40 MG tablet  ? Other Relevant Orders  ? EKG 12-Lead  ? NICM (nonischemic cardiomyopathy) (HCC)      ? Relevant Medications  ? carvedilol (COREG) 3.125 MG tablet  ? losartan (COZAAR) 25 MG tablet  ? simvastatin (ZOCOR) 40 MG tablet  ? Other Relevant Orders  ? EKG 12-Lead  ? Pulmonary hypertension, unspecified (HCC)      ? Relevant Medications  ? carvedilol (COREG) 3.125 MG tablet  ? losartan (COZAAR) 25 MG tablet  ? simvastatin (ZOCOR) 40 MG tablet  ? Essential hypertension      ? Relevant Medications  ? carvedilol (COREG) 3.125 MG tablet  ? losartan (COZAAR) 25 MG tablet  ? simvastatin (ZOCOR) 40 MG tablet  ? Coronary artery disease, non-occlusive      ? Relevant Medications  ? carvedilol (COREG) 3.125 MG tablet  ? losartan (COZAAR) 25 MG tablet  ? simvastatin (ZOCOR) 40 MG tablet  ? Hyperlipidemia LDL goal <70      ? Relevant Medications  ? carvedilol (COREG) 3.125 MG tablet  ? losartan (COZAAR) 25 MG tablet  ? simvastatin (ZOCOR) 40 MG tablet  ? ?  ?1. NSTEMI/cardiomyopathy ?Markedly elevated troponin in the hospital September  2020 ?--Catheterization performed, nonischemic cardiomyopathy ?Echo 03/2019: EF 50 to 55% ?Not on spiroloactone secondary to hyperkalemia in the past ?No medication changes made ? ?Essential hypertension ?[place], [employer]

## 2021-07-16 ENCOUNTER — Emergency Department: Payer: Medicare Other

## 2021-07-16 ENCOUNTER — Other Ambulatory Visit: Payer: Self-pay

## 2021-07-16 ENCOUNTER — Emergency Department
Admission: EM | Admit: 2021-07-16 | Discharge: 2021-07-16 | Disposition: A | Discharge status: Home / Self Care | Payer: Medicare Other | Attending: Emergency Medicine | Admitting: Emergency Medicine

## 2021-07-16 ENCOUNTER — Encounter: Payer: Self-pay | Admitting: *Deleted

## 2021-07-16 DIAGNOSIS — M1712 Unilateral primary osteoarthritis, left knee: Secondary | ICD-10-CM | POA: Insufficient documentation

## 2021-07-16 DIAGNOSIS — W010XXA Fall on same level from slipping, tripping and stumbling without subsequent striking against object, initial encounter: Secondary | ICD-10-CM | POA: Insufficient documentation

## 2021-07-16 DIAGNOSIS — Z7982 Long term (current) use of aspirin: Secondary | ICD-10-CM | POA: Insufficient documentation

## 2021-07-16 DIAGNOSIS — M25562 Pain in left knee: Secondary | ICD-10-CM

## 2021-07-16 MED ORDER — PREDNISONE 10 MG PO TABS: 10.0000 mg | ORAL_TABLET | Freq: Every day | ORAL | 0 refills | Status: DC

## 2021-07-16 NOTE — ED Triage Notes (Signed)
Pt has left knee pain and swelling.  Pt fell on a carpeted floor.  Pt lives alone.  Pt did not pass out.  Pt alert  speech clear. ?

## 2021-07-16 NOTE — ED Provider Notes (Signed)
?[facility] EMERGENCY DEPARTMENT ?Provider Note ? ? ?CSN: [id number] ?Arrival date & time: 07/16/21  1453 ? ?  ? ?History ? ?Chief Complaint  ?Patient presents with  ? Knee Pain  ? ? ?Erica Perry is a 88 y.o. female.  Presents to the emergency department for evaluation of acute left knee pain.  Patient tripped over a rug and fell earlier today, injured her left knee.  She thinks she fell mostly on her right side but is not having any right lower extremity discomfort.  She believes she twisted her left knee.  She denies any groin or thigh pain.  Prior to her fall earlier today she was walking with no assistive devices, now she states she is able to walk but has to hold onto an assist device to help with ambulation.  Patient denies any head injury, LOC, nausea vomiting or neck pain.  Pain is mild mostly along the medial tibial plateau, she is taken Tylenol with some relief. ? ?HPI ? ?  ? ?Home Medications ?Prior to Admission medications   ?Medication Sig Start Date End Date Taking? Authorizing Provider  ?predniSONE (DELTASONE) 10 MG tablet Take 1 tablet (10 mg total) by mouth daily. 6,5,4,3,2,1 six day taper 07/16/21  Yes Duanne Guess, PA-C  ?Acetaminophen 500 MG coapsule Take 250 mg by mouth daily as needed for pain.    [provider]  ?aspirin 81 MG tablet Take 81 mg by mouth daily.    [provider]  ?carvedilol (COREG) 3.125 MG tablet Take 1 tablet (3.125 mg total) by mouth 2 (two) times daily with a meal. 06/01/21   Gollan, Kathlene November, MD  ?levothyroxine (SYNTHROID, LEVOTHROID) 75 MCG tablet Take 75 mcg by mouth daily before breakfast.    [provider]  ?LORazepam (ATIVAN) 0.5 MG tablet Take 1 tablet by mouth 2 (two) times daily. 01/04/16   [provider]  ?losartan (COZAAR) 25 MG tablet Take 1 tablet (25 mg total) by mouth daily. 06/01/21   Minna Merritts, MD  ?polyethylene glycol powder (GLYCOLAX/MIRALAX) 17 GM/SCOOP powder 1/2 -1 capful once a  day with water to prevent constipation.  May put powder in coffee or water 08/12/18   Johnn Hai, PA-C  ?simvastatin (ZOCOR) 40 MG tablet Take 1 tablet (40 mg total) by mouth daily. 06/01/21   Minna Merritts, MD  ?timolol (TIMOPTIC) 0.5 % ophthalmic solution Place 1 drop into both eyes daily. 11/17/18   [provider]  ?   ? ?Allergies    ?Meperidine and related, Midazolam hcl, Sulfa antibiotics, Aleve [naproxen sodium], and Codeine sulfate   ? ?Review of Systems   ?Review of Systems ? ?Physical Exam ?Updated Vital Signs ?BP (!) 184/93 (BP Location: Left Arm)   Pulse 60   Temp 98.4 ?F (36.9 ?C) (Oral)   Resp 20   Ht 5\' 3"  (1.6 m)   Wt 43.5 kg   SpO2 91%   BMI 17.01 kg/m?  ?Physical Exam ?Constitutional:   ?   Appearance: She is well-developed.  ?HENT:  ?   Head: Normocephalic and atraumatic.  ?   Right Ear: Ear canal and external ear normal.  ?   Left Ear: Ear canal and external ear normal.  ?   Nose: Nose normal.  ?Eyes:  ?   Conjunctiva/sclera: Conjunctivae normal.  ?Cardiovascular:  ?   Rate and Rhythm: Normal rate.  ?Pulmonary:  ?   Effort: Pulmonary effort is normal. No respiratory distress.  ?Musculoskeletal:     ?  General: Normal range of motion.  ?   Cervical back: Normal range of motion.  ?   Comments: Patient is able to actively straight leg raise.  Patella is nontender to palpation.  Nontender along the distal femur.  Full range of motion of the hip with no discomfort.  She has no swelling warmth erythema or edema.  She is very tender along the medial tibial plateau.  Nontender throughout the distal tib-fib region, ankle.  No skin breakdown noted.  ?Skin: ?   General: Skin is warm.  ?   Findings: No rash.  ?Neurological:  ?   Mental Status: She is alert and oriented to person, place, and time.  ?Psychiatric:     ?   Behavior: Behavior normal.     ?   Thought Content: Thought content normal.  ? ? ?ED Results / Procedures / Treatments   ?Labs ?(all labs ordered are listed, but only  abnormal results are displayed) ?Labs Reviewed - No data to display ? ?EKG ?None ? ?Radiology ?CT Knee Left Wo Contrast ? ?Result Date: 07/16/2021 ?CLINICAL DATA:  Knee pain, stress fracture suspected, neg xray tibial plateau fracture suspected EXAM: CT OF THE LEFT KNEE WITHOUT CONTRAST TECHNIQUE: Multidetector CT imaging of the left knee was performed according to the standard protocol. Multiplanar CT image reconstructions were also generated. RADIATION DOSE REDUCTION: This exam was performed according to the departmental dose-optimization program which includes automated exposure control, adjustment of the mA and/or kV according to patient size and/or use of iterative reconstruction technique. COMPARISON:  Left knee radiograph 07/16/2021 FINDINGS: Bones/Joint/Cartilage Osteopenia. There is no evidence of acute fracture. Alignment is normal. There is no significant joint effusion. There is tricompartment osteoarthritis, most prominent in the medial and patellofemoral compartments. Ligaments Suboptimally assessed by CT. Muscles and Tendons No acute myotendinous abnormality by CT. Soft tissues No focal fluid collection. IMPRESSION: No evidence of acute fracture.  No significant joint effusion. Tricompartment osteoarthritis worst in the medial and patellofemoral compartments. Diffuse osteopenia. Electronically Signed   By: Maurine Simmering M.D.   On: 07/16/2021 17:05  ? ?DG Knee Complete 4 Views Left ? ?Result Date: 07/16/2021 ?CLINICAL DATA:  Left knee pain after fall. EXAM: LEFT KNEE - COMPLETE 4+ VIEW COMPARISON:  February 03, 2015. FINDINGS: No evidence of fracture, dislocation, or joint effusion. No evidence of arthropathy or other focal bone abnormality. Soft tissues are unremarkable. IMPRESSION: Negative. Electronically Signed   By: Marijo Conception M.D.   On: 07/16/2021 16:34   ? ?Procedures ?Procedures  ? ? ?Medications Ordered in ED ?Medications - No data to display ? ?ED Course/ Medical Decision Making/ A&P ?  ?                         ?Medical Decision Making ?Amount and/or Complexity of Data Reviewed ?Radiology: ordered. ? ? ?88 year old female with underlying left knee osteoarthritis presents today for mechanical fall, she fell and injured her left knee.  She received a cortisone injection back in March 2023 with excellent relief up until recently with her fall.  She describes medial compartment left knee pain.  She typically ambulates with no assistive devices but today is having to walk with a cane or hold onto things when she walks.  She does not have a walker at home.  X-rays and CT scans of the left knee show no evidence of acute bony abnormality but she does have some underlying osteoarthritis with osteopenia.  No ligamentous  laxity on exam.  Hip exam is unremarkable as her hip moves well with no discomfort.  Patient is given a walker to go home with and is advised to take it easy.  She will take Tylenol and a prednisone taper and will follow-up with orthopedics in 1 week if no improvement ? ? ?Final Clinical Impression(s) / ED Diagnoses ?Final diagnoses:  ?Acute pain of left knee  ?Primary osteoarthritis of left knee  ? ? ?Rx / DC Orders ?ED Discharge Orders   ? ?      Ordered  ?  predniSONE (DELTASONE) 10 MG tablet  Daily       ? 07/16/21 1715  ? ?  ?  ? ?  ? ? ?  ?Duanne Guess, PA-C ?07/16/21 1719 ? ?  ?Rada Hay, MD ?07/16/21 1821 ? ?

## 2021-07-16 NOTE — ED Notes (Signed)
Walker given to patient and pt able to use independently. Pt awaiting ride home.  ?

## 2021-07-16 NOTE — ED Provider Triage Note (Signed)
Emergency Medicine Provider Triage Evaluation Note ? ?Erica Perry, a 88 y.o. female  was evaluated in triage.  Pt complains of independently, presented to the ED for acute left knee pain.  Patient reports falling onto her carpeted floor after she stood up to check adjust her thermostat.  She reports landing on her knee, but denies any head injury or LOC patient also denies any weakness or dizziness prior to the fall.  She presents for evaluation of acute left knee pain.. ? ?Review of Systems  ?Positive: Left knee pain  ?Negative: Head injury, LOC ? ?Physical Exam  ?BP (!) 184/93 (BP Location: Left Arm)   Pulse 60   Temp 98.4 ?F (36.9 ?C) (Oral)   Resp 20   Ht 5\' 3"  (1.6 m)   Wt 43.5 kg   SpO2 91%   BMI 17.01 kg/m?  ?Gen:   Awake, no distress   ?Resp:  Normal effort CTA ?MSK:   Moves extremities without difficulty left knee without deformity or dislocation.  ?Other:   ? ?Medical Decision Making  ?Medically screening exam initiated at 4:01 PM.  Appropriate orders placed.  Erica Perry was informed that the remainder of the evaluation will be completed by another provider, this initial triage assessment does not replace that evaluation, and the importance of remaining in the ED until their evaluation is complete. ? ?Geriatric patient to the ED with complaints of acute left knee pain after mechanical fall at home.  She denies any head injury or LOC. ?  ?Erica Needles, PA-C ?07/16/21 1619 ? ?

## 2021-07-16 NOTE — Discharge Instructions (Signed)
Please rest ice and elevate the left knee.  Use a walker as needed for ambulation.  You may take Tylenol for mild to moderate pain.  Take prednisone taper as prescribed and follow-up with your orthopedist in 1 week if no improvement. ?

## 2021-08-05 ENCOUNTER — Other Ambulatory Visit: Payer: Self-pay

## 2021-08-05 ENCOUNTER — Emergency Department: Payer: Medicare Other

## 2021-08-05 ENCOUNTER — Emergency Department
Admission: EM | Admit: 2021-08-05 | Discharge: 2021-08-05 | Disposition: A | Discharge status: Home / Self Care | Payer: Medicare Other | Attending: Emergency Medicine | Admitting: Emergency Medicine

## 2021-08-05 DIAGNOSIS — W06XXXA Fall from bed, initial encounter: Secondary | ICD-10-CM | POA: Insufficient documentation

## 2021-08-05 DIAGNOSIS — S0990XA Unspecified injury of head, initial encounter: Secondary | ICD-10-CM

## 2021-08-05 DIAGNOSIS — S0003XA Contusion of scalp, initial encounter: Secondary | ICD-10-CM | POA: Insufficient documentation

## 2021-08-05 DIAGNOSIS — W19XXXA Unspecified fall, initial encounter: Secondary | ICD-10-CM

## 2021-08-05 MED ORDER — ACETAMINOPHEN 500 MG PO TABS
1000.0000 mg | ORAL_TABLET | Freq: Once | ORAL | Status: AC
  Administered 2021-08-05: 1000 mg via ORAL
  Filled 2021-08-05: qty 2

## 2021-08-05 NOTE — Discharge Instructions (Signed)
Use Tylenol for pain and fevers.  Up to 1000 mg per dose, up to 4 times per day.  Do not take more than 4000 mg of Tylenol/acetaminophen within 24 hours..  

## 2021-08-05 NOTE — ED Notes (Addendum)
Pt resting in bed comfortably. Pt drinking coffee and eating graham crackers. Pt daughter contacted for transportation by night shift RN. This RN will follow up with daughter. Pt brief clean and dry. Pt denies any further needs at this time.

## 2021-08-05 NOTE — ED Notes (Signed)
Message left for the pt's daughter for a ride home.

## 2021-08-05 NOTE — ED Notes (Signed)
Secretary spoke with pt's daughter who is on the way to get pt.

## 2021-08-05 NOTE — ED Provider Notes (Signed)
[facility] Provider Note    Event Date/Time   First MD Initiated Contact with Patient 08/05/21 [phone]     (approximate)   History   Fall (Pt states she was on her way to the bathroom, fell on her rt side and hit her head. Pt takes 81mg  of aspirin daily. She's alert and oriented, speaking in full sentences, breathing easy and unlabored. No sign of acute distress.)   HPI  Erica Perry is a 88 y.o. female who presents to the ED for evaluation of Fall (Pt states she was on her way to the bathroom, fell on her rt side and hit her head. Pt takes 81mg  of aspirin daily. She's alert and oriented, speaking in full sentences, breathing easy and unlabored. No sign of acute distress.)   I reviewed PCP visit from 5/25 where she followed up after a prior ED visit for a fall.  Has arthritis of the left knee, but had no other significant injuries from her fall.  She presents to the ED today after rolling out of her bed and falling again.  She lives at home by herself.  She reports that she accidentally rolled out of bed and struck her head on the carpeted floor.  Denies any syncope or additional trauma.  She is requesting some water and says she feels fine otherwise.   Physical Exam   Triage Vital Signs: ED Triage Vitals  Enc Vitals Group     BP      Pulse      Resp      Temp      Temp src      SpO2      Weight      Height      Head Circumference      Peak Flow      Pain Score      Pain Loc      Pain Edu?      Excl. in GC?     Most recent vital signs: Vitals:   08/05/21 0531  BP: (!) 173/107  Pulse: 81  Resp: 18  Temp: 97.6 F (36.4 C)  SpO2: 96%    General: Awake, no distress.  Pleasant and conversational.  Quite sharp.  Looks well. CV:  Good peripheral perfusion.  Resp:  Normal effort.  Abd:  No distention.  Soft and benign throughout MSK:  Is a small, 2 cm, close hematoma to the right-sided occiput.  No cervical tenderness or otherwise signs of  trauma to the back. Palpation of all 4 extremities without evidence of deformity, tenderness or impaired range of motion.  No signs of acute trauma. Neuro:  No focal deficits appreciated. Other:     ED Results / Procedures / Treatments   Labs (all labs ordered are listed, but only abnormal results are displayed) Labs Reviewed - No data to display  EKG   RADIOLOGY CT head interpreted by me without evidence of acute intracranial pathology CT cervical spine reviewed by me without evidence of acute fracture or dislocation  Official radiology report(s): CT HEAD WO CONTRAST (6/25)  Result Date: 08/05/2021 CLINICAL DATA:  Head trauma, minor.  Neck trauma. EXAM: CT HEAD WITHOUT CONTRAST CT CERVICAL SPINE WITHOUT CONTRAST TECHNIQUE: Multidetector CT imaging of the head and cervical spine was performed following the standard protocol without intravenous contrast. Multiplanar CT image reconstructions of the cervical spine were also generated. RADIATION DOSE REDUCTION: This exam was performed according to the departmental dose-optimization program which  includes automated exposure control, adjustment of the mA and/or kV according to patient size and/or use of iterative reconstruction technique. COMPARISON:  05/26/2013 FINDINGS: CT HEAD FINDINGS Brain: No evidence of acute infarction, hemorrhage, hydrocephalus, extra-axial collection or mass lesion/mass effect. Remote right posterior frontal cortex infarct, interval since 2015. Cerebral volume loss and chronic small vessel ischemic gliosis is also progressed since that time Vascular: No hyperdense vessel or unexpected calcification. Skull: Right posterior scalp hematoma.  No calvarial fracture. Sinuses/Orbits: Bilateral cataract resection CT CERVICAL SPINE FINDINGS Alignment: No traumatic malalignment. Skull base and vertebrae: Generalized osteopenia.  No acute fracture Soft tissues and spinal canal: No prevertebral fluid or swelling. No visible canal  hematoma. Disc levels:  Ordinary degenerative changes for age Upper chest: No visible injury IMPRESSION: 1. No evidence of acute intracranial or cervical spine injury. 2. Right posterior scalp hematoma without calvarial fracture. 3. Aging of the brain since comparison in 2015. Remote right frontal infarct. Electronically Signed   By: Tiburcio Pea M.D.   On: 08/05/2021 05:57   CT Cervical Spine Wo Contrast  Result Date: 08/05/2021 CLINICAL DATA:  Head trauma, minor.  Neck trauma. EXAM: CT HEAD WITHOUT CONTRAST CT CERVICAL SPINE WITHOUT CONTRAST TECHNIQUE: Multidetector CT imaging of the head and cervical spine was performed following the standard protocol without intravenous contrast. Multiplanar CT image reconstructions of the cervical spine were also generated. RADIATION DOSE REDUCTION: This exam was performed according to the departmental dose-optimization program which includes automated exposure control, adjustment of the mA and/or kV according to patient size and/or use of iterative reconstruction technique. COMPARISON:  05/26/2013 FINDINGS: CT HEAD FINDINGS Brain: No evidence of acute infarction, hemorrhage, hydrocephalus, extra-axial collection or mass lesion/mass effect. Remote right posterior frontal cortex infarct, interval since 2015. Cerebral volume loss and chronic small vessel ischemic gliosis is also progressed since that time Vascular: No hyperdense vessel or unexpected calcification. Skull: Right posterior scalp hematoma.  No calvarial fracture. Sinuses/Orbits: Bilateral cataract resection CT CERVICAL SPINE FINDINGS Alignment: No traumatic malalignment. Skull base and vertebrae: Generalized osteopenia.  No acute fracture Soft tissues and spinal canal: No prevertebral fluid or swelling. No visible canal hematoma. Disc levels:  Ordinary degenerative changes for age Upper chest: No visible injury IMPRESSION: 1. No evidence of acute intracranial or cervical spine injury. 2. Right posterior scalp  hematoma without calvarial fracture. 3. Aging of the brain since comparison in 2015. Remote right frontal infarct. Electronically Signed   By: Tiburcio Pea M.D.   On: 08/05/2021 05:57    PROCEDURES and INTERVENTIONS:  Procedures  Medications  acetaminophen (TYLENOL) tablet 1,000 mg (1,000 mg Oral Given 08/05/21 4098)     IMPRESSION / MDM / ASSESSMENT AND PLAN / ED COURSE  I reviewed the triage vital signs and the nursing notes.  Differential diagnosis includes, but is not limited to, cervical fracture, ICH, skull fracture, scalp laceration, syncope, seizure  {Patient presents with symptoms of an acute illness or injury that is potentially life-threatening.  88 year old woman who is very pleasant, functional and sharp presents from her home where she lives by herself after a mechanical fall.  She has a small hematoma on her scalp but otherwise looks great.  No signs of neurologic or vascular deficits.  No other signs of trauma.  CT head and neck without evidence of ICH, skull fracture or cervical fracture.  No laceration to require repair and no bleeding.  She is at her baseline and I see no barriers to continued outpatient management.  We discussed return  precautions and she is suitable for outpatient management.  Clinical Course as of 08/05/21 3435  Wynelle Link Aug 05, 2021  0627 Reassessed and discussed reassuring CT results.  We discussed safe mobility at home and return precautions for the ED.  Discussed Tylenol dosing. [DS]    Clinical Course User Index [DS] Delton Prairie, MD     FINAL CLINICAL IMPRESSION(S) / ED DIAGNOSES   Final diagnoses:  Fall, initial encounter  Minor head injury, initial encounter  Scalp hematoma, initial encounter     Rx / DC Orders   ED Discharge Orders     None        Note:  This document was prepared using Dragon voice recognition software and may include unintentional dictation errors.   Delton Prairie, MD 08/05/21 [phone]

## 2021-08-07 ENCOUNTER — Other Ambulatory Visit: Payer: Self-pay

## 2021-08-07 ENCOUNTER — Encounter: Payer: Self-pay | Admitting: Emergency Medicine

## 2021-08-07 ENCOUNTER — Inpatient Hospital Stay
Admission: EM | Admit: 2021-08-07 | Discharge: 2021-08-12 | DRG: 562 | Disposition: A | Discharge status: Skilled Nursing Facility | Payer: Medicare Other | Attending: Internal Medicine | Admitting: Internal Medicine

## 2021-08-07 ENCOUNTER — Emergency Department: Payer: Medicare Other

## 2021-08-07 DIAGNOSIS — R296 Repeated falls: Secondary | ICD-10-CM | POA: Diagnosis present

## 2021-08-07 DIAGNOSIS — I10 Essential (primary) hypertension: Secondary | ICD-10-CM | POA: Diagnosis present

## 2021-08-07 DIAGNOSIS — Z66 Do not resuscitate: Secondary | ICD-10-CM | POA: Diagnosis present

## 2021-08-07 DIAGNOSIS — S82145A Nondisplaced bicondylar fracture of left tibia, initial encounter for closed fracture: Principal | ICD-10-CM | POA: Diagnosis present

## 2021-08-07 DIAGNOSIS — S82192A Other fracture of upper end of left tibia, initial encounter for closed fracture: Secondary | ICD-10-CM

## 2021-08-07 DIAGNOSIS — W19XXXA Unspecified fall, initial encounter: Secondary | ICD-10-CM | POA: Diagnosis present

## 2021-08-07 DIAGNOSIS — Z751 Person awaiting admission to adequate facility elsewhere: Secondary | ICD-10-CM

## 2021-08-07 DIAGNOSIS — E871 Hypo-osmolality and hyponatremia: Secondary | ICD-10-CM | POA: Diagnosis not present

## 2021-08-07 DIAGNOSIS — E785 Hyperlipidemia, unspecified: Secondary | ICD-10-CM | POA: Diagnosis present

## 2021-08-07 DIAGNOSIS — E039 Hypothyroidism, unspecified: Secondary | ICD-10-CM | POA: Diagnosis present

## 2021-08-07 DIAGNOSIS — J9601 Acute respiratory failure with hypoxia: Secondary | ICD-10-CM | POA: Diagnosis present

## 2021-08-07 DIAGNOSIS — K219 Gastro-esophageal reflux disease without esophagitis: Secondary | ICD-10-CM | POA: Diagnosis present

## 2021-08-07 DIAGNOSIS — Z79899 Other long term (current) drug therapy: Secondary | ICD-10-CM

## 2021-08-07 DIAGNOSIS — J449 Chronic obstructive pulmonary disease, unspecified: Secondary | ICD-10-CM | POA: Diagnosis present

## 2021-08-07 DIAGNOSIS — Z87891 Personal history of nicotine dependence: Secondary | ICD-10-CM

## 2021-08-07 LAB — CBC WITH DIFFERENTIAL/PLATELET
Abs Immature Granulocytes: 0.04 10*3/uL (ref 0.00–0.07)
Basophils Absolute: 0 10*3/uL (ref 0.0–0.1)
Basophils Relative: 0 %
Eosinophils Absolute: 0.1 10*3/uL (ref 0.0–0.5)
Eosinophils Relative: 1 %
HCT: 36.2 % (ref 36.0–46.0)
Hemoglobin: 11.6 g/dL — ABNORMAL LOW (ref 12.0–15.0)
Immature Granulocytes: 0 %
Lymphocytes Relative: 12 %
Lymphs Abs: 1.2 10*3/uL (ref 0.7–4.0)
MCH: 29.4 pg (ref 26.0–34.0)
MCHC: 32 g/dL (ref 30.0–36.0)
MCV: 91.9 fL (ref 80.0–100.0)
Monocytes Absolute: 1 10*3/uL (ref 0.1–1.0)
Monocytes Relative: 10 %
Neutro Abs: 7.9 10*3/uL — ABNORMAL HIGH (ref 1.7–7.7)
Neutrophils Relative %: 77 %
Platelets: 265 10*3/uL (ref 150–400)
RBC: 3.94 MIL/uL (ref 3.87–5.11)
RDW: 13.8 % (ref 11.5–15.5)
WBC: 10.3 10*3/uL (ref 4.0–10.5)
nRBC: 0 % (ref 0.0–0.2)

## 2021-08-07 LAB — COMPREHENSIVE METABOLIC PANEL
ALT: 27 U/L (ref 0–44)
AST: 32 U/L (ref 15–41)
Albumin: 3.4 g/dL — ABNORMAL LOW (ref 3.5–5.0)
Alkaline Phosphatase: 81 U/L (ref 38–126)
Anion gap: 10 (ref 5–15)
BUN: 10 mg/dL (ref 8–23)
CO2: 26 mmol/L (ref 22–32)
Calcium: 8.6 mg/dL — ABNORMAL LOW (ref 8.9–10.3)
Chloride: 90 mmol/L — ABNORMAL LOW (ref 98–111)
Creatinine, Ser: 0.48 mg/dL (ref 0.44–1.00)
GFR, Estimated: >60 mL/min (ref >60)
Glucose, Bld: 114 mg/dL — ABNORMAL HIGH (ref 70–99)
Potassium: 4.4 mmol/L (ref 3.5–5.1)
Sodium: 126 mmol/L — ABNORMAL LOW (ref 135–145)
Total Bilirubin: 0.6 mg/dL (ref 0.3–1.2)
Total Protein: 6.2 g/dL — ABNORMAL LOW (ref 6.5–8.1)

## 2021-08-07 MED ORDER — POLYETHYLENE GLYCOL 3350 17 G PO PACK
17.0000 g | PACK | Freq: Every day | ORAL | Status: DC
  Administered 2021-08-08 – 2021-08-09 (×2): 17 g via ORAL
  Filled 2021-08-07 (×5): qty 1

## 2021-08-07 MED ORDER — LEVOTHYROXINE SODIUM 50 MCG PO TABS
75.0000 ug | ORAL_TABLET | Freq: Every day | ORAL | Status: DC
  Administered 2021-08-08 – 2021-08-12 (×5): 75 ug via ORAL
  Filled 2021-08-07 (×5): qty 2

## 2021-08-07 MED ORDER — TRAMADOL HCL 50 MG PO TABS: 50.0000 mg | ORAL_TABLET | Freq: Four times a day (QID) | ORAL | Status: DC | PRN

## 2021-08-07 MED ORDER — LORAZEPAM 0.5 MG PO TABS
0.5000 mg | ORAL_TABLET | Freq: Two times a day (BID) | ORAL | Status: DC
  Administered 2021-08-07 – 2021-08-12 (×10): 0.5 mg via ORAL
  Filled 2021-08-07 (×10): qty 1

## 2021-08-07 MED ORDER — ACETAMINOPHEN 500 MG PO TABS: 250.0000 mg | ORAL_TABLET | Freq: Every day | ORAL | Status: DC | PRN

## 2021-08-07 MED ORDER — SODIUM CHLORIDE 0.9 % IV BOLUS
500.0000 mL | Freq: Once | INTRAVENOUS | Status: AC
  Administered 2021-08-07: 500 mL via INTRAVENOUS

## 2021-08-07 MED ORDER — CARVEDILOL 6.25 MG PO TABS
3.1250 mg | ORAL_TABLET | Freq: Two times a day (BID) | ORAL | Status: DC
  Administered 2021-08-08 – 2021-08-11 (×7): 3.125 mg via ORAL
  Filled 2021-08-07 (×7): qty 1

## 2021-08-07 MED ORDER — FAMOTIDINE 20 MG PO TABS
20.0000 mg | ORAL_TABLET | Freq: Two times a day (BID) | ORAL | Status: DC
  Administered 2021-08-07 – 2021-08-12 (×10): 20 mg via ORAL
  Filled 2021-08-07 (×10): qty 1

## 2021-08-07 MED ORDER — SIMVASTATIN 20 MG PO TABS
40.0000 mg | ORAL_TABLET | Freq: Every day | ORAL | Status: DC
  Administered 2021-08-07 – 2021-08-11 (×5): 40 mg via ORAL
  Filled 2021-08-07: qty 4
  Filled 2021-08-07 (×4): qty 2

## 2021-08-07 MED ORDER — ACETAMINOPHEN 325 MG PO TABS
650.0000 mg | ORAL_TABLET | Freq: Once | ORAL | Status: AC
Start: 2021-08-07 — End: 2021-08-07
  Administered 2021-08-07: 650 mg via ORAL
  Filled 2021-08-07: qty 2

## 2021-08-07 MED ORDER — LOSARTAN POTASSIUM 25 MG PO TABS
25.0000 mg | ORAL_TABLET | Freq: Every day | ORAL | Status: DC
  Administered 2021-08-07 – 2021-08-12 (×6): 25 mg via ORAL
  Filled 2021-08-07 (×6): qty 1

## 2021-08-07 MED ORDER — OXYCODONE HCL 5 MG PO TABS
2.5000 mg | ORAL_TABLET | Freq: Once | ORAL | Status: AC
  Administered 2021-08-07: 2.5 mg via ORAL
  Filled 2021-08-07: qty 1

## 2021-08-07 MED ORDER — ASPIRIN 81 MG PO TBEC
81.0000 mg | DELAYED_RELEASE_TABLET | Freq: Every day | ORAL | Status: DC
  Administered 2021-08-08 – 2021-08-12 (×5): 81 mg via ORAL
  Filled 2021-08-07 (×5): qty 1

## 2021-08-07 NOTE — ED Triage Notes (Addendum)
Pt arrived from home via EMS with worsening left knee and hip pain after multiple falls. Just prior to arrival pt working with Physical Therapy at home and screamed upon standing with pain to the back of left knee.  Slight inner rotation of left foot noted by EMS. EMS vs  169/103 HR 70 96% on 2L (was 87% on room air after medication administration).   Fentanyl provided by EMS 50 mcg with 7/10 pain rating. 25 mcg given unable to give rating for pain. Pt seen in this ED Sunday after fall. CT of her head performed due to hitting head on rocking chair but pt had no other new complaints at that time.

## 2021-08-07 NOTE — ED Provider Notes (Signed)
[facility] Provider Note    Event Date/Time   First MD Initiated Contact with Patient 08/07/21 1516     (approximate)   History   Leg Pain   HPI  Erica Perry is a 88 y.o. female with history as listed below presents to the emergency department for evaluation of left knee and leg pain. While working with PT at home today, she stood up and had sudden onset sever pain in the left knee. She was assisted into the chair. She has had multiple falls recently. No other complaint today.    Past Medical History:  Diagnosis Date   Compression fracture of thoracic vertebra (HCC)    COPD (chronic obstructive pulmonary disease) (HCC)    Depression    Dysuria    Female bladder prolapse    Glaucoma    Hyperlipidemia    Hypertension    Hypothyroidism    IBS (irritable bowel syndrome)    Incomplete emptying of bladder    Urethral prolapse          Physical Exam   Triage Vital Signs: ED Triage Vitals  Enc Vitals Group     BP 08/07/21 1415 (!) 162/85     Pulse Rate 08/07/21 1415 67     Resp 08/07/21 1415 19     Temp 08/07/21 1415 (!) 97.5 F (36.4 C)     Temp Source 08/07/21 1415 Oral     SpO2 08/07/21 1415 98 %     Weight 08/07/21 1429 95 lb (43.1 kg)     Height 08/07/21 1429 5\' 3"  (1.6 m)     Head Circumference --      Peak Flow --      Pain Score 08/07/21 1420 5     Pain Loc --      Pain Edu? --      Excl. in GC? --     Most recent vital signs: Vitals:   08/07/21 1900 08/07/21 2000  BP: (!) 151/95 (!) 128/112  Pulse: 70 72  Resp: 17 18  Temp:    SpO2: 97% 98%    General: Awake, no distress.  CV:  Good peripheral perfusion.  Resp:  Normal effort.  Abd:  No distention.  Other:  Mild edema of the left lower extremity below knee. No obvious deformity. Pulse, motor and sensory function intact. Skin over left knee warmer than on the right but without erythema or open wound.   ED Results / Procedures / Treatments   Labs (all labs  ordered are listed, but only abnormal results are displayed) Labs Reviewed  CBC WITH DIFFERENTIAL/PLATELET - Abnormal; Notable for the following components:      Result Value   Hemoglobin 11.6 (*)    Neutro Abs 7.9 (*)    All other components within normal limits  COMPREHENSIVE METABOLIC PANEL - Abnormal; Notable for the following components:   Sodium 126 (*)    Chloride 90 (*)    Glucose, Bld 114 (*)    Calcium 8.6 (*)    Total Protein 6.2 (*)    Albumin 3.4 (*)    All other components within normal limits  URINALYSIS, ROUTINE W REFLEX MICROSCOPIC     EKG  Not indicated.   RADIOLOGY  Image of the left knee shows fracture of the medial tibial metaphysis with buckling of the cortex and linear sclerosis in the medial tibial plateau.  I have independently reviewed and interpreted imaging as well as reviewed report from radiology.  PROCEDURES:  Critical Care performed: No  Procedures   MEDICATIONS ORDERED IN ED:  Medications  acetaminophen (TYLENOL) tablet 250 mg (has no administration in time range)  aspirin EC tablet 81 mg (has no administration in time range)  carvedilol (COREG) tablet 3.125 mg (has no administration in time range)  famotidine (PEPCID) tablet 20 mg (has no administration in time range)  levothyroxine (SYNTHROID) tablet 75 mcg (has no administration in time range)  LORazepam (ATIVAN) tablet 0.5 mg (has no administration in time range)  losartan (COZAAR) tablet 25 mg (25 mg Oral Given 08/07/21 2025)  polyethylene glycol (MIRALAX / GLYCOLAX) packet 17 g (has no administration in time range)  simvastatin (ZOCOR) tablet 40 mg (has no administration in time range)  traMADol (ULTRAM) tablet 50 mg (has no administration in time range)  oxyCODONE (Oxy IR/ROXICODONE) immediate release tablet 2.5 mg (2.5 mg Oral Given 08/07/21 1717)  acetaminophen (TYLENOL) tablet 650 mg (650 mg Oral Given 08/07/21 1717)     IMPRESSION / MDM / ASSESSMENT AND PLAN / ED COURSE    I reviewed the triage vital signs and the nursing notes.                              Differential diagnosis includes, but is not limited to: knee strain; musculoskeletal pain; fracture; knee effusion  Patient's presentation is most consistent with acute, uncomplicated illness.  88 year old female presents to the emergency department for evaluation of left lower extremity pain. See HPI.   Patient has had multiple falls recently and was in the ER as recently as the 28  after she rolled out of her bed and hit her head on the floor.   Today's exam is concerning for non-traumatic injury to the left knee/lower extremity. She denies other complaint. Daughter is at bedside.   Clinical Course as of 08/07/21 2132  Tue Aug 07, 2021  1656 Consulted with Dr. Joice Lofts who recommends knee immobilizer and limited weight bearing with walker.  Discussed with patient and daughter [CT]  1728 Patient able to ambulate with walker after knee immobilizer applied. [CT]  [id number] Patient and daughters are discussing plan for discharge home or rehabilitation placement. Patient prefers to go home, however she lives alone and has no one that can be there 24/7. [CT]  2029 Patient and family prefer rehab/SNF placement. Transition of care team consult ordered. [CT]    Clinical Course User Index [CT] Theadora Noyes B, FNP     FINAL CLINICAL IMPRESSION(S) / ED DIAGNOSES   Final diagnoses:  None     Rx / DC Orders   ED Discharge Orders     None        Note:  This document was prepared using Dragon voice recognition software and may include unintentional dictation errors.   Chinita Pester, FNP 08/07/21 2133    Merwyn Katos, MD 08/07/21 [phone]

## 2021-08-08 ENCOUNTER — Emergency Department: Payer: Medicare Other

## 2021-08-08 DIAGNOSIS — E871 Hypo-osmolality and hyponatremia: Secondary | ICD-10-CM | POA: Diagnosis not present

## 2021-08-08 LAB — BASIC METABOLIC PANEL
Anion gap: 8 (ref 5–15)
BUN: 8 mg/dL (ref 8–23)
CO2: 28 mmol/L (ref 22–32)
Calcium: 8.2 mg/dL — ABNORMAL LOW (ref 8.9–10.3)
Chloride: 89 mmol/L — ABNORMAL LOW (ref 98–111)
Creatinine, Ser: 0.38 mg/dL — ABNORMAL LOW (ref 0.44–1.00)
GFR, Estimated: >60 mL/min (ref >60)
Glucose, Bld: 95 mg/dL (ref 70–99)
Potassium: 4.2 mmol/L (ref 3.5–5.1)
Sodium: 125 mmol/L — ABNORMAL LOW (ref 135–145)

## 2021-08-08 LAB — URINALYSIS, ROUTINE W REFLEX MICROSCOPIC
Bilirubin Urine: NEGATIVE
Glucose, UA: NEGATIVE mg/dL
Hgb urine dipstick: NEGATIVE
Ketones, ur: NEGATIVE mg/dL
Leukocytes,Ua: NEGATIVE
Nitrite: NEGATIVE
Protein, ur: NEGATIVE mg/dL
Specific Gravity, Urine: 1.01 (ref 1.005–1.030)
pH: 6 (ref 5.0–8.0)

## 2021-08-08 LAB — BRAIN NATRIURETIC PEPTIDE: B Natriuretic Peptide: 331.7 pg/mL — ABNORMAL HIGH (ref 0.0–100.0)

## 2021-08-08 LAB — SODIUM, URINE, RANDOM: Sodium, Ur: 83 mmol/L

## 2021-08-08 LAB — CBC
HCT: 35.4 % — ABNORMAL LOW (ref 36.0–46.0)
Hemoglobin: 11.5 g/dL — ABNORMAL LOW (ref 12.0–15.0)
MCH: 29.9 pg (ref 26.0–34.0)
MCHC: 32.5 g/dL (ref 30.0–36.0)
MCV: 91.9 fL (ref 80.0–100.0)
Platelets: 261 10*3/uL (ref 150–400)
RBC: 3.85 MIL/uL — ABNORMAL LOW (ref 3.87–5.11)
RDW: 13.9 % (ref 11.5–15.5)
WBC: 9.1 10*3/uL (ref 4.0–10.5)
nRBC: 0 % (ref 0.0–0.2)

## 2021-08-08 LAB — CORTISOL: Cortisol, Plasma: 12.5 ug/dL

## 2021-08-08 LAB — TROPONIN I (HIGH SENSITIVITY): Troponin I (High Sensitivity): 9 ng/L (ref <18)

## 2021-08-08 LAB — TSH: TSH: 9.852 u[IU]/mL — ABNORMAL HIGH (ref 0.350–4.500)

## 2021-08-08 LAB — PHOSPHORUS: Phosphorus: 4 mg/dL (ref 2.5–4.6)

## 2021-08-08 LAB — OSMOLALITY, URINE: Osmolality, Ur: 333 mOsm/kg (ref 300–900)

## 2021-08-08 LAB — OSMOLALITY: Osmolality: 267 mOsm/kg — ABNORMAL LOW (ref 275–295)

## 2021-08-08 LAB — MAGNESIUM: Magnesium: 2.2 mg/dL (ref 1.7–2.4)

## 2021-08-08 MED ORDER — TRAMADOL HCL 50 MG PO TABS
50.0000 mg | ORAL_TABLET | Freq: Two times a day (BID) | ORAL | Status: DC | PRN
  Administered 2021-08-08 – 2021-08-11 (×3): 50 mg via ORAL
  Filled 2021-08-08 (×4): qty 1

## 2021-08-08 MED ORDER — ENOXAPARIN SODIUM 30 MG/0.3ML IJ SOSY: 30.0000 mg | PREFILLED_SYRINGE | INTRAMUSCULAR | Status: DC

## 2021-08-08 MED ORDER — ACETAMINOPHEN 325 MG PO TABS
650.0000 mg | ORAL_TABLET | Freq: Every day | ORAL | Status: DC | PRN
Start: 2021-08-08 — End: 2021-08-09
  Administered 2021-08-08 – 2021-08-09 (×2): 650 mg via ORAL
  Filled 2021-08-08 (×3): qty 2

## 2021-08-08 MED ORDER — TIMOLOL MALEATE 0.5 % OP SOLN
1.0000 [drp] | Freq: Every day | OPHTHALMIC | Status: DC
  Administered 2021-08-08 – 2021-08-12 (×5): 1 [drp] via OPHTHALMIC
  Filled 2021-08-08: qty 5

## 2021-08-08 NOTE — ED Provider Notes (Signed)
Consulted with the hospitalist regarding the patient's hyponatremia mild oxygen requirement and some suspicion that she may be suffering from volume overload or possible CHF.  Discussed, recommended she may need echo and probably further work-up as to hyponatremia because and also mild hypoxia and I suspect they may be related to volume overload.  Hospitalist excepting patient admission Dr. Legrand Pitts, MD 08/08/21 [phone]

## 2021-08-08 NOTE — ED Provider Notes (Signed)
  DG Chest Portable 1 View  Result Date: 08/08/2021 CLINICAL DATA:  Mild hypoxia rest EXAM: PORTABLE CHEST 1 VIEW COMPARISON:  Chest x-ray dated Aug 07, 2021 FINDINGS: Cardiac and mediastinal contours are unchanged. Unchanged interstitial opacities which are most pronounced in the upper lungs. No focal consolidation. No large pleural effusion or pneumothorax. IMPRESSION: Unchanged interstitial opacities which are most pronounced in the upper lungs, differential considerations include pulmonary edema or underlying chronic lung disease. No focal consolidation. Electronically Signed   By: Allegra Lai M.D.   On: 08/08/2021 08:39    ----------------------------------------- 1:04 PM on 08/08/2021 ----------------------------------------- Labs reviewed, patient has notable hyponatremia at sodium 125, was 126 yesterday.  BNP is elevated at 331 and given her chest x-ray findings and 2 L oxygen requirement I suspect she may be suffering some element of potential volume overload.  Given her oxygen requirement fatigue fall in the sodium indicative hyponatremia plan discussed with hospitalist for admission for further inpatient work-up     Sharyn Creamer, MD 08/08/21 1334

## 2021-08-08 NOTE — H&P (Signed)
History and Physical    Patient: Erica Perry XVQ:[id number] DOB: 03-12-1924 DOA: 08/07/2021 DOS: the patient was seen and examined on 08/08/2021 PCP: Gracelyn Nurse, MD  Patient coming from: Home  Chief Complaint:  Chief Complaint  Patient presents with   Leg Pain   HPI: Erica Perry is a 88 y.o. female with medical history significant of hypothyroidism, hypertension, hyperlipidemia, COPD not on chronic oxygen, former tobacco use, chronic benzodiazepine use on Ativan, GERD, ambulatory dysfunction uses a walker, osteoarthritis.  This patient presents to the emergency department after having recurrent falls at home.  First fall was on the eighth and last fall was on Sunday when she was using her walker and coming back from the bathroom and she attempted to open the door and fell.  Denies any presyncope, syncope or any other prodromal symptoms prior to fall.  Denies any chest pain.  Denies any shortness of breath.  Denies any cough or fevers.  No urinary complaints.  Denies any tremors, muscle cramping.  No peripheral edema.  She states that she is on a low-sodium diet as she was told to follow this diet by her primary care physician.  She uses [employer].  In the emergency department imaging showed a left knee fracture.  ED provider discussed with Dr. Joice Lofts.  He recommended a knee immobilizer and limited weightbearing with walker.  Also the patient was noted to be mildly hypoxic SPO2 dropping to 88% and now on 2 L and saturating well.  Not on home oxygen.  Her sodium was noted to be 126 initially and now down to 125.  The patient is asymptomatic.  Review of care everywhere shows sodium ranged from 131-134 earlier this year.  Review of Systems: As mentioned in the history of present illness. All other systems reviewed and are negative. Past Medical History:  Diagnosis Date   Compression fracture of thoracic vertebra (HCC)    COPD (chronic obstructive pulmonary disease) (HCC)     Depression    Dysuria    Female bladder prolapse    Glaucoma    Hyperlipidemia    Hypertension    Hypothyroidism    IBS (irritable bowel syndrome)    Incomplete emptying of bladder    Urethral prolapse    Past Surgical History:  Procedure Laterality Date   CARDIAC CATHETERIZATION     LEFT HEART CATH AND CORONARY ANGIOGRAPHY N/A 11/20/2018   Procedure: LEFT HEART CATH AND CORONARY ANGIOGRAPHY;  Surgeon: Antonieta Iba, MD;  Location: [facility] INVASIVE [facility];  Service: Cardiovascular;  Laterality: N/A;   SHOULDER SURGERY     Social History:  reports that she quit smoking about 8 years ago. Her smoking use included cigarettes. She has a 15.00 pack-year smoking history. She has never used smokeless tobacco. She reports that she does not drink alcohol and does not use drugs.  Allergies  Allergen Reactions   Meperidine And Related Other (See Comments)    Cannot remember   Midazolam Hcl    Sulfa Antibiotics    Aleve [Naproxen Sodium] Rash   Codeine Sulfate Rash    Family History  Problem Relation Age of Onset   Breast cancer Other 40   Heart failure Mother    Prostate cancer Neg Hx    Kidney cancer Neg Hx    Bladder Cancer Neg Hx     Prior to Admission medications   Medication Sig Start Date End Date Taking? Authorizing Provider  aspirin 81 MG tablet Take 81  mg by mouth daily.   Yes [provider]  carvedilol (COREG) 3.125 MG tablet Take 1 tablet (3.125 mg total) by mouth 2 (two) times daily with a meal. 06/01/21  Yes Gollan, Tollie Pizza, MD  famotidine (PEPCID) 20 MG tablet Take 20 mg by mouth 2 (two) times daily. 06/27/21  Yes [provider]  levothyroxine (SYNTHROID, LEVOTHROID) 75 MCG tablet Take 75 mcg by mouth daily before breakfast.   Yes [provider]  LORazepam (ATIVAN) 0.5 MG tablet Take 1 tablet by mouth 2 (two) times daily. 01/04/16  Yes [provider]  losartan (COZAAR) 25 MG tablet Take 1 tablet (25 mg total) by mouth daily.  06/01/21  Yes Gollan, Tollie Pizza, MD  polyethylene glycol powder (GLYCOLAX/MIRALAX) 17 GM/SCOOP powder 1/2 -1 capful once a day with water to prevent constipation.  May put powder in coffee or water 08/12/18  Yes Bridget Hartshorn L, PA-C  timolol (TIMOPTIC) 0.5 % ophthalmic solution Place 1 drop into both eyes daily. 11/17/18  Yes [provider]  acetaminophen (TYLENOL) 500 MG tablet Take 250 mg by mouth daily as needed for pain.    [provider]  predniSONE (DELTASONE) 10 MG tablet Take 1 tablet (10 mg total) by mouth daily. 6,5,4,3,2,1 six day taper Patient not taking: Reported on 08/08/2021 07/16/21   Evon Slack, PA-C  simvastatin (ZOCOR) 40 MG tablet Take 1 tablet (40 mg total) by mouth daily. 06/01/21   Antonieta Iba, MD  traMADol (ULTRAM) 50 MG tablet Take 50 mg by mouth every 6 (six) hours as needed. 07/25/21   [provider]    Physical Exam: Vitals:   08/08/21 0900 08/08/21 0915 08/08/21 0930 08/08/21 0945  BP: (!) 144/72  (!) 151/84   Pulse: 76 76 75 80  Resp:      Temp:      TempSrc:      SpO2: 97% 98% 100% 98%  Weight:      Height:       General:  Appears calm and comfortable and is in NAD, thin and frail. Cardiovascular:  RRR, no m/r/g.  Respiratory:   CTA bilaterally with no wheezes/rales/rhonchi.  Normal respiratory effort. Abdomen:  soft, NT, ND, NABS Skin:  no rash or induration seen on limited exam Musculoskeletal:  grossly normal tone BUE/BLE, good ROM, no bony abnormality Lower extremity:  No LE edema.  Limited foot exam with no ulcerations.  2+ distal pulses. LLE in knee immobilizer. Psychiatric:  grossly normal mood and affect, speech fluent and appropriate, AOx3 Neurologic:  CN 2-12 grossly intact, moves all extremities in coordinated fashion, sensation intact   Data Reviewed: EKG shows sinus rhythm at 70 p.m., sodium 125, chloride 89, hemoglobin 11.5, hematocrit 35.4, BNP 331.  Assessment and Plan: Left knee fracture: Imaging  shows minimally impacted insufficiency fracture involving the medial tibial metaphysis, with slight buckling of the cortex and linear sclerosis in the medial tibial plateau as above. PT/OT consulted. TOC consulted for placement. Will formally consult Ortho. Dr. Joice Lofts recommended knee immobilizer and limited weight bearing with walker.  Recurrent falls: Appears to be mechanical.  PT/OT.  Fall precautions ordered. CT head on 5/28 shows right posterior scalp hematoma. Hold DVT PPX.  Hyponatremia: Likely due to low solute intake. Asymptomatic. Sodium 125. Previous sodium 131-134 on Care Everywhere. Obtain urine Na and osmoles. Obtain serum osmoles.  We will start regular diet and obtain repeat BMP in the morning. Consider salt tabs short term until improvement and then discontinue  low Na diet on discharge and continue with regular diet. Can discuss with nephrology if no improvement.   Acute hypoxic respiratory failure: Mild. Now on 2L. BNP elevated at 331. Previous ECHO on 03/2019 showed LVEF 55-60, G1DD, no valvular abnormalities.  Obtain repeat echo.  Chest x-ray shows mild pulmonary edema versus chronic lung disease.   Hypothyroidism: Continue home Synthroid.  Obtain repeat TSH.  Chronic benzodiazepine use: Continue home Ativan  Hypertension: Continue home Cozaar and Coreg  Hyperlipidemia: Continue home Zocor  GERD: Continue home Pepcid  DVT PPX: SCD's  Advance Care Planning: DNR  Consults: Ortho  Family Communication: 3 daughters present at bedside upon my evaluation  Severity of Illness: The appropriate patient status for this patient is OBSERVATION. Observation status is judged to be reasonable and necessary in order to provide the required intensity of service to ensure the patient's safety. The patient's presenting symptoms, physical exam findings, and initial radiographic and laboratory data in the context of their medical condition is felt to place them at decreased risk for further  clinical deterioration. Furthermore, it is anticipated that the patient will be medically stable for discharge from the hospital within 2 midnights of admission.   Author: Verdia Kuba, DO 08/08/2021 2:08 PM  For on call review [email or web address].

## 2021-08-08 NOTE — Consult Note (Signed)
ORTHOPAEDIC CONSULTATION  REQUESTING PHYSICIAN: Leslee Home, DO  Chief Complaint:   Left knee pain  History of Present Illness: Erica Perry is a 88 y.o. female with multiple medical problems including hypertension, hyperlipidemia, hypothyroidism, depression, COPD, and osteoporosis resulting in a prior compression fracture of the thoracic vertebrae.  The patient ambulates with a walker and was in her usual state of health until yesterday when she fell and complained of acute onset of sharp left knee pain.  She had fallen 3 weeks ago and again 1 week ago and was brought to the emergency room each time but x-rays and subsequent CT scanning were unremarkable, so the patient was discharged home.  X-rays in the emergency room yesterday demonstrated the presence of cortical buckling along the medial aspect of the proximal tibial metaphysis, suggestive of an insufficiency fracture of the medial tibial plateau.  Therefore, the patient was admitted for pain management and probable rehab placement.  The patient denies any associated injury resulting from this most recent fall.  She did not strike her head or lose consciousness.  In addition, the patient denies any lightheadedness, dizziness, chest pain, shortness of breath, or other symptoms which may have precipitated this fall.  Past Medical History:  Diagnosis Date   Compression fracture of thoracic vertebra (HCC)    COPD (chronic obstructive pulmonary disease) (HCC)    Depression    Dysuria    Female bladder prolapse    Glaucoma    Hyperlipidemia    Hypertension    Hypothyroidism    IBS (irritable bowel syndrome)    Incomplete emptying of bladder    Urethral prolapse    Past Surgical History:  Procedure Laterality Date   CARDIAC CATHETERIZATION     LEFT HEART CATH AND CORONARY ANGIOGRAPHY N/A 11/20/2018   Procedure: LEFT HEART CATH AND CORONARY ANGIOGRAPHY;  Surgeon:  Minna Merritts, MD;  Location: [place] [facility];  Service: Cardiovascular;  Laterality: N/A;   SHOULDER SURGERY     Social History   Socioeconomic History   Marital status: Divorced    Spouse name: Not on file   Number of children: Not on file   Years of education: Not on file   Highest education level: Not on file  Occupational History   Not on file  Tobacco Use   Smoking status: Former    Packs/day: 0.50    Years: 30.00    Pack years: 15.00    Types: Cigarettes    Quit date: 08/09/2013    Years since quitting: 8.0   Smokeless tobacco: Never  Vaping Use   Vaping Use: Never used  Substance and Sexual Activity   Alcohol use: No    Alcohol/week: 0.0 standard drinks   Drug use: Never   Sexual activity: Not on file  Other Topics Concern   Not on file  Social History Narrative   Not on file   Social Determinants of Health   Financial Resource Strain: Not on file  Food Insecurity: Not on file  Transportation Needs: Not on file  Physical Activity: Not on file  Stress: Not on file  Social Connections: Not on file   Family History  Problem Relation Age of Onset   Breast cancer Other 68   Heart failure Mother    Prostate cancer Neg Hx    Kidney cancer Neg Hx    Bladder Cancer Neg Hx    Allergies  Allergen Reactions   Meperidine And Related Other (See Comments)  Cannot remember   Midazolam Hcl    Sulfa Antibiotics    Aleve [Naproxen Sodium] Rash   Codeine Sulfate Rash   Prior to Admission medications   Medication Sig Start Date End Date Taking? Authorizing Provider  aspirin 81 MG tablet Take 81 mg by mouth daily.   Yes [provider]  carvedilol (COREG) 3.125 MG tablet Take 1 tablet (3.125 mg total) by mouth 2 (two) times daily with a meal. 06/01/21  Yes Gollan, Kathlene November, MD  famotidine (PEPCID) 20 MG tablet Take 20 mg by mouth 2 (two) times daily. 06/27/21  Yes [provider]  levothyroxine (SYNTHROID, LEVOTHROID) 75 MCG tablet Take 75  mcg by mouth daily before breakfast.   Yes [provider]  LORazepam (ATIVAN) 0.5 MG tablet Take 1 tablet by mouth 2 (two) times daily. 01/04/16  Yes [provider]  losartan (COZAAR) 25 MG tablet Take 1 tablet (25 mg total) by mouth daily. 06/01/21  Yes Gollan, Kathlene November, MD  polyethylene glycol powder (GLYCOLAX/MIRALAX) 17 GM/SCOOP powder 1/2 -1 capful once a day with water to prevent constipation.  May put powder in coffee or water 08/12/18  Yes Letitia Neri L, PA-C  timolol (TIMOPTIC) 0.5 % ophthalmic solution Place 1 drop into both eyes daily. 11/17/18  Yes [provider]  acetaminophen (TYLENOL) 500 MG tablet Take 250 mg by mouth daily as needed for pain.    [provider]  simvastatin (ZOCOR) 40 MG tablet Take 1 tablet (40 mg total) by mouth daily. 06/01/21   Minna Merritts, MD  traMADol (ULTRAM) 50 MG tablet Take 50 mg by mouth every 6 (six) hours as needed. 07/25/21   [provider]   DG Chest 1 View  Result Date: 08/07/2021 CLINICAL DATA:  Multiple falls EXAM: CHEST  1 VIEW COMPARISON:  03/09/2016 FINDINGS: Single frontal view of the chest demonstrates a stable cardiac silhouette. Chronic ectasia and atherosclerosis of the thoracic aorta. No acute airspace disease, effusion, or pneumothorax. No acute displaced fracture. IMPRESSION: 1. No acute intrathoracic process. Electronically Signed   By: Randa Ngo M.D.   On: 08/07/2021 19:17   DG Pelvis 1-2 Views  Result Date: 08/07/2021 CLINICAL DATA:  Worsening left knee and hip pain after fall EXAM: PELVIS - 1-2 VIEW COMPARISON:  None Available. FINDINGS: Supine frontal view of the pelvis was performed. No acute fracture, subluxation, or dislocation. Moderate symmetrical bilateral hip osteoarthritis. Sacroiliac joints are unremarkable. Soft tissues are grossly normal. IMPRESSION: 1. Symmetrical bilateral hip osteoarthritis.  No acute fracture. Electronically Signed   By: Randa Ngo M.D.   On:  08/07/2021 15:17   DG Chest Portable 1 View  Result Date: 08/08/2021 CLINICAL DATA:  Mild hypoxia rest EXAM: PORTABLE CHEST 1 VIEW COMPARISON:  Chest x-ray dated Aug 07, 2021 FINDINGS: Cardiac and mediastinal contours are unchanged. Unchanged interstitial opacities which are most pronounced in the upper lungs. No focal consolidation. No large pleural effusion or pneumothorax. IMPRESSION: Unchanged interstitial opacities which are most pronounced in the upper lungs, differential considerations include pulmonary edema or underlying chronic lung disease. No focal consolidation. Electronically Signed   By: Yetta Glassman M.D.   On: 08/08/2021 08:39   DG Knee Complete 4 Views Left  Result Date: 08/07/2021 CLINICAL DATA:  Worsening left knee and hip pain, multiple falls EXAM: LEFT KNEE - COMPLETE 4+ VIEW COMPARISON:  07/16/2021 FINDINGS: Frontal, bilateral oblique, and cross-table lateral views of the left knee are obtained. Bones are diffusely osteopenic. Since the previous  exam, linear sclerosis has developed within the medial tibial plateau perpendicular to the trabecular lines, with subtle buckling of the medial tibial metaphyseal cortex. Findings are consistent with insufficiency fracture. No other acute bony abnormalities. Stable mild 3 compartmental osteoarthritis. Trace left knee effusion has developed. There is diffuse atherosclerosis. IMPRESSION: 1. Minimally impacted insufficiency fracture involving the medial tibial metaphysis, with slight buckling of the cortex and linear sclerosis in the medial tibial plateau as above. This has developed in the interim since prior exams. 2. Trace left knee effusion. Electronically Signed   By: Randa Ngo M.D.   On: 08/07/2021 15:20    Positive ROS: All other systems have been reviewed and were otherwise negative with the exception of those mentioned in the HPI and as above.  Physical Exam: General:  Alert, no acute distress Psychiatric:  Patient is  competent for consent with normal mood and affect   Cardiovascular:  No pedal edema Respiratory:  No wheezing, non-labored breathing GI:  Abdomen is soft and non-tender Skin:  No lesions in the area of chief complaint Neurologic:  Sensation intact distally Lymphatic:  No axillary or cervical lymphadenopathy  Orthopedic Exam:  Orthopedic examination is limited to the left knee and lower extremity.  Skin inspection around the left knee is notable for mild swelling, but no erythema, abrasions, or other skin abnormalities are identified.  She has moderate tenderness to palpation over the medial aspect of the tibial plateau, but there is no lateral knee pain nor is there any peripatellar pain.  She is neurovascularly intact to the left lower extremity and foot.  X-rays:  Recent x-rays of the left knee are available for review and have been reviewed by myself.  These films demonstrate evidence of an insufficiency/impaction fracture of the medial tibial plateau as manifest by mild cortical buckling medially.  The joint itself appears to be well-maintained.  There is evidence of mild tricompartmental degenerative changes, as well as a trace effusion.  Diffuse osteopenia also was noted throughout the visualized bony structures.  Assessment: Insufficiency/impaction fracture medial tibial plateau left knee.  Plan: The treatment options were discussed with the patient.  Based on the patient's age, medical comorbidities, and overall fracture alignment, I do not feel that this fracture requires surgical intervention.  The patient is pleased with this determination.  The patient can be treated with a knee immobilizer.  She may be mobilized with physical therapy toe-touch to at most partial weightbearing on the left leg so long as the leg is in a knee immobilizer, using a walker for balance and support.  The patient may require skilled nursing facility placement while the fracture heals as I can imagine that it  will be quite difficult for the patient to get around while observing these restrictions.  Thank you for asked me to participate in the care of this most delightful woman.  I will be happy to follow her with you.   Pascal Lux, MD  Beeper #:  [phone]  08/08/2021 3:20 PM

## 2021-08-08 NOTE — ED Notes (Signed)
Pt placed on 2L [place], MD notified

## 2021-08-08 NOTE — Progress Notes (Signed)
   08/08/21 1020  Clinical Encounter Type  Visited With Patient  Visit Type Initial;Other (Comment) (rounding)  [place] engaged pt to greet her and ask about her well-being. Chaplain B offered hospitality (blanket) and offered prayer at pt's request. Very pleasant and visit appreciated.

## 2021-08-08 NOTE — Progress Notes (Signed)
PHARMACIST - PHYSICIAN COMMUNICATION  CONCERNING:  Enoxaparin (Lovenox) for DVT Prophylaxis    RECOMMENDATION: Patient was prescribed enoxaparin 40mg  q24 hours for VTE prophylaxis.   Filed Weights   08/07/21 1429  Weight: 43.1 kg (95 lb)    Body mass index is 16.83 kg/m.  Estimated Creatinine Clearance: 29.3 mL/min (A) (by C-G formula based on SCr of 0.38 mg/dL (L)).  Patient is candidate for enoxaparin 30mg  every 24 hours based on CrCl <21ml/min or Weight <45kg  DESCRIPTION: Pharmacy has adjusted enoxaparin dose per [facility] policy.  Patient is now receiving enoxaparin 30 mg every 24 hours   31m 08/08/2021 1:37 PM

## 2021-08-08 NOTE — TOC Initial Note (Signed)
Transition of Care [facility]) - Initial/Assessment Note    Patient Details  Name: Erica Perry MRN: [id number] Date of Birth: 1924/06/13  Transition of Care [facility]) CM/SW Contact:    Erica Hutching, RN Phone Number: 08/08/2021, 1:26 PM  Clinical Narrative:                 Patient came into the emergency room after falling at home, this is the second fall this month.  TOC consult for SNF placement.  Patient's sodium is low and she is requiring acute O2, ED physician will consult hospitalist for admission.   RNCM met with patient and the family at the bedside, patient has 3 daughters.  Patient lives in an apartment alone, usually able to walk with a walker.  She does not drive, her daughters will provide transportation for appointments.  Erica Perry lives in [place], other 2 daughters live in [place] area.   Patient and daughters would like for the patient to go for short term rehab, RNCM did talk with them about the patient needing a 3 night inpatient stay for Medicare to cover rehab, they verbalize understanding.   Patient does have Medicaid and they would be willing to do LTC with her Medicaid if she does not get the 3 night inpatient stay.   Patient and family prefer placement close to [facility].  TOC will start bed search.  Expected Discharge Plan: Skilled Nursing Facility Barriers to Discharge: Continued Medical Work up   Patient Goals and CMS Choice Patient states their goals for this hospitalization and ongoing recovery are:: Patient hopes for short term rehab CMS Medicare.gov Compare Post Acute Care list provided to:: Patient Choice offered to / list presented to : Patient, Adult Children  Expected Discharge Plan and Services Expected Discharge Plan: [place]   Discharge Planning Services: CM Consult Post Acute Care Choice: [place] Living arrangements for the past 2 months: Apartment                 DME Arranged: N/A DME Agency: NA       HH  Arranged: NA HH Agency: NA        Prior Living Arrangements/Services Living arrangements for the past 2 months: Apartment Lives with:: Self Patient language and need for interpreter reviewed:: Yes Do you feel safe going back to the place where you live?: Yes      Need for Family Participation in Patient Care: Yes (Comment) Care giver support system in place?: Yes (comment) (daughters) Current home services: DME [facility]) Criminal Activity/Legal Involvement Pertinent to Current Situation/Hospitalization: No - Comment as needed  Activities of Daily Living      Permission Sought/Granted Permission sought to share information with : Case Freight forwarder, Customer service manager, Family Supports Permission granted to share information with : Yes, Verbal Permission Granted  Share Information with NAME: Erica Perry  Permission granted to share info w AGENCY: SNF's closer to [place] granted to share info w Relationship: daughter  Permission granted to share info w Contact Information: [phone]  Emotional Assessment Appearance:: Appears stated age Attitude/Demeanor/Rapport: Engaged Affect (typically observed): Accepting Orientation: : Oriented to Self, Oriented to Place, Oriented to  Time, Oriented to Situation Alcohol / Substance Use: Not Applicable Psych Involvement: No (comment)  Admission diagnosis:  fall ems Patient Active Problem List   Diagnosis Date Noted   ACS (acute coronary syndrome) ([place]) 11/19/2018   Compression fracture 08/11/2014   Difficult or painful urination 03/24/2014  Abnormal finding on GI tract imaging 03/15/2014   Abdominal pain, epigastric 03/15/2014   CAFL (chronic airflow limitation) ([place]) 12/28/2013   HLD (hyperlipidemia) 12/28/2013   Adult hypothyroidism 12/28/2013   Adaptive colitis 12/28/2013   Chronic obstructive pulmonary disease ([place]) 12/28/2013   Chronic constipation 10/19/2013   PCP:  Baxter Hire, MD Pharmacy:    [place], [place] - [place] [address] [place] [address] Phone: [phone] Fax: [phone]  [place], [place] - [place] [address] Phone: [phone] Fax: [phone]     Social Determinants of Health (SDOH) Interventions    Readmission Risk Interventions     View : No data to display.

## 2021-08-08 NOTE — ED Notes (Signed)
Pt has family at bedside

## 2021-08-08 NOTE — NC FL2 (Signed)
[place] MEDICAID [id number] LEVEL OF CARE SCREENING TOOL     IDENTIFICATION  Patient Name: Erica Perry Birthdate: Jun 13, 1924 Sex: female Admission Date (Current Location): 08/07/2021  [facility] and [place] Number:  Chiropodist and Address:  [facility], [address] [place], [place] [address]      Provider Number: [id number]  Attending Physician Name and Address:  Verdia Kuba, DO  Relative Name and Phone Number:  Emeline Gins- daughter [phone]    Current Level of Care: Hospital Recommended Level of Care: Skilled Nursing Facility Prior Approval Number:    Date Approved/Denied:   PASRR Number: [id number]  Discharge Plan: SNF    Current Diagnoses: Patient Active Problem List   Diagnosis Date Noted   Hyponatremia 08/08/2021   ACS (acute coronary syndrome) ([facility]) 11/19/2018   Compression fracture 08/11/2014   Difficult or painful urination 03/24/2014   Abnormal finding on GI tract imaging 03/15/2014   Abdominal pain, epigastric 03/15/2014   CAFL (chronic airflow limitation) ([facility]) 12/28/2013   HLD (hyperlipidemia) 12/28/2013   Adult hypothyroidism 12/28/2013   Adaptive colitis 12/28/2013   Chronic obstructive pulmonary disease ([facility]) 12/28/2013   Chronic constipation 10/19/2013    Orientation RESPIRATION BLADDER Height & Weight     Self, Time, Situation, Place  O2 (2L) Continent Weight: 43.1 kg Height:  5\' 3"  (160 cm)  BEHAVIORAL SYMPTOMS/MOOD NEUROLOGICAL BOWEL NUTRITION STATUS      Continent Diet (Regular)  AMBULATORY STATUS COMMUNICATION OF NEEDS Skin   Extensive Assist Verbally Normal                       Personal Care Assistance Level of Assistance  Bathing, Feeding, Dressing Bathing Assistance: Limited assistance Feeding assistance: Independent Dressing Assistance: Limited assistance     Functional Limitations Info  Sight, Hearing, Speech Sight Info: Adequate Hearing Info: Adequate Speech Info:  Adequate    SPECIAL CARE FACTORS FREQUENCY  PT (By licensed PT), OT (By licensed OT)     PT Frequency: 5 times per week OT Frequency: 5 times per week            Contractures Contractures Info: Not present    Additional Factors Info  Code Status, Allergies Code Status Info: DNR Allergies Info: Meperidine, Midazolam, sulfa Antibiotics, aleve, codeine           Current Medications (08/08/2021):  This is the current hospital active medication list Current Facility-Administered Medications  Medication Dose Route Frequency Provider Last Rate Last Admin   acetaminophen (TYLENOL) tablet 650 mg  650 mg Oral Daily PRN 08/10/2021 S, DO       aspirin EC tablet 81 mg  81 mg Oral Daily Triplett, Cari B, FNP   81 mg at 08/08/21 0943   carvedilol (COREG) tablet 3.125 mg  3.125 mg Oral BID WC Triplett, Cari B, FNP   3.125 mg at 08/08/21 0745   enoxaparin (LOVENOX) injection 30 mg  30 mg Subcutaneous Q24H Anwar, Shayan S, DO       famotidine (PEPCID) tablet 20 mg  20 mg Oral BID Triplett, Cari B, FNP   20 mg at 08/08/21 0943   levothyroxine (SYNTHROID) tablet 75 mcg  75 mcg Oral QAC breakfast Triplett, Cari B, FNP   75 mcg at 08/08/21 0746   LORazepam (ATIVAN) tablet 0.5 mg  0.5 mg Oral BID Triplett, Cari B, FNP   0.5 mg at 08/08/21 0943   losartan (COZAAR) tablet 25 mg  25 mg Oral  Daily Triplett, Cari B, FNP   25 mg at 08/08/21 0943   polyethylene glycol (MIRALAX / GLYCOLAX) packet 17 g  17 g Oral Daily Triplett, Cari B, FNP   17 g at 08/08/21 0944   simvastatin (ZOCOR) tablet 40 mg  40 mg Oral Daily Triplett, Cari B, FNP   40 mg at 08/07/21 2134   traMADol (ULTRAM) tablet 50 mg  50 mg Oral Q12H PRN Verdia Kuba, DO       Current Outpatient Medications  Medication Sig Dispense Refill   aspirin 81 MG tablet Take 81 mg by mouth daily.     carvedilol (COREG) 3.125 MG tablet Take 1 tablet (3.125 mg total) by mouth 2 (two) times daily with a meal. 180 tablet 3   famotidine (PEPCID) 20 MG  tablet Take 20 mg by mouth 2 (two) times daily.     levothyroxine (SYNTHROID, LEVOTHROID) 75 MCG tablet Take 75 mcg by mouth daily before breakfast.     LORazepam (ATIVAN) 0.5 MG tablet Take 1 tablet by mouth 2 (two) times daily.     losartan (COZAAR) 25 MG tablet Take 1 tablet (25 mg total) by mouth daily. 90 tablet 3   polyethylene glycol powder (GLYCOLAX/MIRALAX) 17 GM/SCOOP powder 1/2 -1 capful once a day with water to prevent constipation.  May put powder in coffee or water 255 g 0   timolol (TIMOPTIC) 0.5 % ophthalmic solution Place 1 drop into both eyes daily.     acetaminophen (TYLENOL) 500 MG tablet Take 250 mg by mouth daily as needed for pain.     predniSONE (DELTASONE) 10 MG tablet Take 1 tablet (10 mg total) by mouth daily. 6,5,4,3,2,1 six day taper (Patient not taking: Reported on 08/08/2021) 21 tablet 0   simvastatin (ZOCOR) 40 MG tablet Take 1 tablet (40 mg total) by mouth daily. 90 tablet 3   traMADol (ULTRAM) 50 MG tablet Take 50 mg by mouth every 6 (six) hours as needed.       Discharge Medications: Please see discharge summary for a list of discharge medications.  Relevant Imaging Results:  Relevant Lab Results:   Additional Information SS# [id number]  Allayne Butcher, RN

## 2021-08-08 NOTE — ED Notes (Signed)
Case management with pt

## 2021-08-08 NOTE — Evaluation (Signed)
Physical Therapy Evaluation Patient Details Name: Erica Perry MRN: [id number] DOB: 08-25-1924 Today's Date: 08/08/2021  History of Present Illness  Pt is a 88 y.o. female with medical history significant of hypothyroidism, hypertension, hyperlipidemia, COPD not on chronic oxygen, former tobacco use, chronic benzodiazepine use on Ativan, GERD, ambulatory dysfunction uses a walker, osteoarthritis.  This patient presents to the emergency department after having recurrent falls at home. MD assessment includes: Insufficiency/impaction fracture medial tibial plateau left knee, recurrent falls, hyponatremia, and acute hypoxic respiratory failure.   Clinical Impression  Pt was pleasant and motivated to participate during the session and put forth good effort throughout. Pt required min A with bed mobility and transfers along with cuing for proper sequencing and was only able to take several very small, shuffling steps from bed to chair.  Pt reported no adverse symptoms during the session other than L knee pain with SpO2 and HR WNL on supplemental O2.  Of note pt with a diagnosis of COPD but stated does not use O2 at home.  Pt has a history of recent falls and would not be safe to return to her prior living situation at this time.  Pt will benefit from PT services in a SNF setting upon discharge to safely address deficits listed in patient problem list for decreased caregiver assistance and eventual return to PLOF.         Recommendations for follow up therapy are one component of a multi-disciplinary discharge planning process, led by the attending physician.  Recommendations may be updated based on patient status, additional functional criteria and insurance authorization.  Follow Up Recommendations Skilled nursing-short term rehab (<3 hours/day)    Assistance Recommended at Discharge Frequent or constant Supervision/Assistance  Patient can return home with the following  Two people to help with  walking and/or transfers;A lot of help with bathing/dressing/bathroom;Assistance with cooking/housework;Direct supervision/assist for medications management;Help with stairs or ramp for entrance;Assist for transportation    Equipment Recommendations Other (comment) (TBD at next venue of care)  Recommendations for Other Services       Functional Status Assessment Patient has had a recent decline in their functional status and demonstrates the ability to make significant improvements in function in a reasonable and predictable amount of time.     Precautions / Restrictions Precautions Precautions: Fall Required Braces or Orthoses: Knee Immobilizer - Left Restrictions Weight Bearing Restrictions: Yes LLE Weight Bearing: Partial weight bearing Other Position/Activity Restrictions: Per Ortho MD note, TTWB to max of PWB with KI donned to LLE      Mobility  Bed Mobility Overal bed mobility: Needs Assistance Bed Mobility: Supine to Sit     Supine to sit: Min assist     General bed mobility comments: Min A for BLE and trunk control    Transfers Overall transfer level: Needs assistance Equipment used: Rolling walker (2 wheels) Transfers: Sit to/from Stand Sit to Stand: Min assist, From elevated surface           General transfer comment: Mod verbal and tactile cues for sequencing including LLE and hand placement/positioning    Ambulation/Gait Ambulation/Gait assistance: Min guard Gait Distance (Feet): 2 Feet Assistive device: Rolling walker (2 wheels) Gait Pattern/deviations: Shuffle Gait velocity: decreased     General Gait Details: Pt unable to pick up RLE from the floor and maintain LLE WB compliance but was able to perform small, shuffling steps from bed to chair with cues for sequencing  Stairs  Wheelchair Mobility    Modified Rankin (Stroke Patients Only)       Balance Overall balance assessment: Needs assistance, History of  Falls Sitting-balance support: Feet unsupported, Single extremity supported Sitting balance-Leahy Scale: Good     Standing balance support: Bilateral upper extremity supported, During functional activity, Reliant on assistive device for balance Standing balance-Leahy Scale: Poor                               Pertinent Vitals/Pain Pain Assessment Pain Assessment: 0-10 Pain Score: 5  Pain Location: L knee Pain Descriptors / Indicators: Sore Pain Intervention(s): Repositioned, Premedicated before session, Monitored during session, Patient requesting pain meds-RN notified    Home Living Family/patient expects to be discharged to:: Private residence Living Arrangements: Alone Available Help at Discharge: Friend(s);Family;Available PRN/intermittently Type of Home: House Home Access: Stairs to enter Entrance Stairs-Rails: None Entrance Stairs-Number of Steps: 1   Home Layout: One level Home Equipment: Agricultural consultant (2 wheels)      Prior Function Prior Level of Function : Independent/Modified Independent             Mobility Comments: Mod Ind limited community distances with a RW, 2 falls in the last 6 months secondary to LOB ADLs Comments: Ind with ADLs, sponge bathes     Hand Dominance        Extremity/Trunk Assessment   Upper Extremity Assessment Upper Extremity Assessment: Generalized weakness    Lower Extremity Assessment Lower Extremity Assessment: Generalized weakness;LLE deficits/detail LLE: Unable to fully assess due to immobilization       Communication   Communication: No difficulties  Cognition Arousal/Alertness: Awake/alert Behavior During Therapy: WFL for tasks assessed/performed Overall Cognitive Status: Within Functional Limits for tasks assessed                                          General Comments      Exercises     Assessment/Plan    PT Assessment Patient needs continued PT services  PT Problem  List Decreased strength;Decreased activity tolerance;Decreased balance;Pain;Decreased safety awareness;Decreased knowledge of precautions       PT Treatment Interventions DME instruction;Stair training;Functional mobility training;Therapeutic activities;Gait training;Therapeutic exercise;Balance training;Patient/family education    PT Goals (Current goals can be found in the Care Plan section)  Acute Rehab PT Goals Patient Stated Goal: To get back to walking PT Goal Formulation: With patient Time For Goal Achievement: 08/21/21 Potential to Achieve Goals: Good    Frequency 7X/week     Co-evaluation               AM-PAC PT "6 Clicks" Mobility  Outcome Measure Help needed turning from your back to your side while in a flat bed without using bedrails?: A Little Help needed moving from lying on your back to sitting on the side of a flat bed without using bedrails?: A Little Help needed moving to and from a bed to a chair (including a wheelchair)?: A Little Help needed standing up from a chair using your arms (e.g., wheelchair or bedside chair)?: A Little Help needed to walk in hospital room?: Total Help needed climbing 3-5 steps with a railing? : Total 6 Click Score: 14    End of Session Equipment Utilized During Treatment: Gait belt;Oxygen Activity Tolerance: Patient tolerated treatment well Patient left: in chair;with call bell/phone within  reach;with chair alarm set;with family/visitor present Nurse Communication: Mobility status;Weight bearing status;Other (comment) (KI donned to LLE) PT Visit Diagnosis: History of falling (Z91.81);Unsteadiness on feet (R26.81);Difficulty in walking, not elsewhere classified (R26.2);Muscle weakness (generalized) (M62.81);Pain Pain - Right/Left: Left Pain - part of body: Knee    Time: 1635-1701 PT Time Calculation (min) (ACUTE ONLY): 26 min   Charges:   PT Evaluation $PT Eval Moderate Complexity: 1 Mod          D. Scott Ishmeal Rorie  PT, DPT 08/08/21, 5:19 PM

## 2021-08-08 NOTE — ED Provider Notes (Signed)
Currently patient pending disposition through [facility] team.  The nurse this morning noted that yesterday patient was on 2 L oxygen, this morning on room air oxygen saturation 88%.  Patient currently sitting up eating breakfast, reports no complaints no chest pain no trouble breathing.  She is very pleasant and in no distress  We will check chest x-ray to evaluate for change.  She is alert well oriented normal respiratory rate her lungs are clear and she has no cardiopulmonary complaint at this time.     Sharyn Creamer, MD 08/08/21 0800

## 2021-08-09 ENCOUNTER — Observation Stay (HOSPITAL_COMMUNITY)
Admit: 2021-08-09 | Discharge: 2021-08-09 | Disposition: A | Discharge status: Home / Self Care | Payer: Medicare Other | Attending: Family Medicine | Admitting: Family Medicine

## 2021-08-09 DIAGNOSIS — Z79899 Other long term (current) drug therapy: Secondary | ICD-10-CM | POA: Diagnosis not present

## 2021-08-09 DIAGNOSIS — Z87891 Personal history of nicotine dependence: Secondary | ICD-10-CM | POA: Diagnosis not present

## 2021-08-09 DIAGNOSIS — R296 Repeated falls: Secondary | ICD-10-CM | POA: Diagnosis present

## 2021-08-09 DIAGNOSIS — E871 Hypo-osmolality and hyponatremia: Secondary | ICD-10-CM | POA: Diagnosis present

## 2021-08-09 DIAGNOSIS — J9601 Acute respiratory failure with hypoxia: Secondary | ICD-10-CM | POA: Diagnosis present

## 2021-08-09 DIAGNOSIS — Z66 Do not resuscitate: Secondary | ICD-10-CM | POA: Diagnosis present

## 2021-08-09 DIAGNOSIS — W19XXXA Unspecified fall, initial encounter: Secondary | ICD-10-CM | POA: Diagnosis present

## 2021-08-09 DIAGNOSIS — J449 Chronic obstructive pulmonary disease, unspecified: Secondary | ICD-10-CM | POA: Diagnosis present

## 2021-08-09 DIAGNOSIS — K219 Gastro-esophageal reflux disease without esophagitis: Secondary | ICD-10-CM | POA: Diagnosis present

## 2021-08-09 DIAGNOSIS — R0609 Other forms of dyspnea: Secondary | ICD-10-CM | POA: Diagnosis not present

## 2021-08-09 DIAGNOSIS — E039 Hypothyroidism, unspecified: Secondary | ICD-10-CM | POA: Diagnosis present

## 2021-08-09 DIAGNOSIS — S82145A Nondisplaced bicondylar fracture of left tibia, initial encounter for closed fracture: Secondary | ICD-10-CM | POA: Diagnosis present

## 2021-08-09 DIAGNOSIS — I10 Essential (primary) hypertension: Secondary | ICD-10-CM | POA: Diagnosis present

## 2021-08-09 DIAGNOSIS — Z751 Person awaiting admission to adequate facility elsewhere: Secondary | ICD-10-CM | POA: Diagnosis not present

## 2021-08-09 DIAGNOSIS — E785 Hyperlipidemia, unspecified: Secondary | ICD-10-CM | POA: Diagnosis present

## 2021-08-09 LAB — CBC
HCT: 32 % — ABNORMAL LOW (ref 36.0–46.0)
Hemoglobin: 10.3 g/dL — ABNORMAL LOW (ref 12.0–15.0)
MCH: 29.3 pg (ref 26.0–34.0)
MCHC: 32.2 g/dL (ref 30.0–36.0)
MCV: 90.9 fL (ref 80.0–100.0)
Platelets: 257 10*3/uL (ref 150–400)
RBC: 3.52 MIL/uL — ABNORMAL LOW (ref 3.87–5.11)
RDW: 13.9 % (ref 11.5–15.5)
WBC: 8.2 10*3/uL (ref 4.0–10.5)
nRBC: 0 % (ref 0.0–0.2)

## 2021-08-09 LAB — BASIC METABOLIC PANEL
Anion gap: 4 — ABNORMAL LOW (ref 5–15)
BUN: 10 mg/dL (ref 8–23)
CO2: 32 mmol/L (ref 22–32)
Calcium: 8.2 mg/dL — ABNORMAL LOW (ref 8.9–10.3)
Chloride: 94 mmol/L — ABNORMAL LOW (ref 98–111)
Creatinine, Ser: 0.46 mg/dL (ref 0.44–1.00)
GFR, Estimated: >60 mL/min (ref >60)
Glucose, Bld: 85 mg/dL (ref 70–99)
Potassium: 4.5 mmol/L (ref 3.5–5.1)
Sodium: 130 mmol/L — ABNORMAL LOW (ref 135–145)

## 2021-08-09 LAB — ECHOCARDIOGRAM COMPLETE
AR max vel: 1.79 cm2
AV Mean grad: 3.5 mmHg
AV Peak grad: 6.5 mmHg
Ao pk vel: 1.27 m/s
Area-P 1/2: 3.77 cm2
Height: 63 in
S' Lateral: 2.5 cm
Weight: 1520 oz

## 2021-08-09 MED ORDER — ADULT MULTIVITAMIN W/MINERALS CH
1.0000 | ORAL_TABLET | Freq: Every day | ORAL | Status: DC
  Administered 2021-08-10 – 2021-08-12 (×3): 1 via ORAL
  Filled 2021-08-09 (×3): qty 1

## 2021-08-09 MED ORDER — ENSURE ENLIVE PO LIQD
237.0000 mL | Freq: Two times a day (BID) | ORAL | Status: DC
  Administered 2021-08-10 – 2021-08-12 (×4): 237 mL via ORAL

## 2021-08-09 MED ORDER — ACETAMINOPHEN 325 MG PO TABS
650.0000 mg | ORAL_TABLET | Freq: Four times a day (QID) | ORAL | Status: DC | PRN
  Administered 2021-08-09 – 2021-08-11 (×5): 650 mg via ORAL
  Filled 2021-08-09 (×4): qty 2

## 2021-08-09 NOTE — Evaluation (Signed)
Occupational Therapy Evaluation Patient Details Name: Erica Perry MRN: [id number] DOB: 1924/03/14 Today's Date: 08/09/2021   History of Present Illness Pt is a 88 y.o. female with medical history significant of hypothyroidism, hypertension, hyperlipidemia, COPD not on chronic oxygen, former tobacco use, chronic benzodiazepine use on Ativan, GERD, ambulatory dysfunction uses a walker, osteoarthritis.  This patient presents to the emergency department after having recurrent falls at home. MD assessment includes: Insufficiency/impaction fracture medial tibial plateau left knee, recurrent falls, hyponatremia, and acute hypoxic respiratory failure.   Clinical Impression   Chart reviewed, pt greeted in bed agreeable to OT tx session. Pt is alert and oriented x4, good awareness of precautions. PTA pt was MOD I-I in ADL; assist with IADL as needed.Pt reports she amb with RW as needed, frequently did not use it around the house. Pt has a history of falls. Pt performs bed mobility with MIN A, STS with MIN A with RW, SPT to bedside commode with MIN A, standing grooming tasks at sink level with CGA. Pt is performing ADL/mobility below PLOF, will benefit from discharge to STR to address functional deficits.    Recommendations for follow up therapy are one component of a multi-disciplinary discharge planning process, led by the attending physician.  Recommendations may be updated based on patient status, additional functional criteria and insurance authorization.   Follow Up Recommendations  Skilled nursing-short term rehab (<3 hours/day)    Assistance Recommended at Discharge Frequent or constant Supervision/Assistance  Patient can return home with the following A lot of help with walking and/or transfers;A lot of help with bathing/dressing/bathroom    Functional Status Assessment  Patient has had a recent decline in their functional status and demonstrates the ability to make significant improvements in  function in a reasonable and predictable amount of time.  Equipment Recommendations  Other (comment) (per next venue of care)    Recommendations for Other Services       Precautions / Restrictions Precautions Precautions: Fall Required Braces or Orthoses: Knee Immobilizer - Left Restrictions Weight Bearing Restrictions: Yes LLE Weight Bearing: Partial weight bearing Other Position/Activity Restrictions: Per Ortho MD note, TTWB to max of PWB with KI donned to LLE      Mobility Bed Mobility                    Transfers                          Balance Overall balance assessment: Needs assistance, History of Falls Sitting-balance support: Feet unsupported, Single extremity supported Sitting balance-Leahy Scale: Good     Standing balance support: Bilateral upper extremity supported, During functional activity, Reliant on assistive device for balance Standing balance-Leahy Scale: Poor                             ADL either performed or assessed with clinical judgement   ADL Overall ADL's : Needs assistance/impaired                                       General ADL Comments: supine>sit with MIN A, STS with MIN Amultiple attempts with RW, SPT To bedside commode adhering to precautions with CGA, toileting with SET UP, grooming standing at sink with supervision-CGA, LB dressing with MAX A     Vision Patient Visual Report:  No change from baseline       Perception     Praxis      Pertinent Vitals/Pain Pain Assessment Pain Assessment: Faces Faces Pain Scale: Hurts a little bit Pain Location: L knee Pain Descriptors / Indicators: Sore, Aching Pain Intervention(s): Limited activity within patient's tolerance, Monitored during session     Hand Dominance     Extremity/Trunk Assessment Upper Extremity Assessment Upper Extremity Assessment: Generalized weakness   Lower Extremity Assessment Lower Extremity Assessment:  Generalized weakness;LLE deficits/detail LLE: Unable to fully assess due to immobilization   Cervical / Trunk Assessment Cervical / Trunk Assessment: Kyphotic   Communication Communication Communication: No difficulties   Cognition Arousal/Alertness: Awake/alert Behavior During Therapy: WFL for tasks assessed/performed Overall Cognitive Status: Within Functional Limits for tasks assessed                                       General Comments       Exercises Other Exercises Other Exercises: edu re: role of OT, role of rehab, discharge recommendations, home safety, falls prevention   Shoulder Instructions      Home Living Family/patient expects to be discharged to:: Private residence Living Arrangements: Alone Available Help at Discharge: Friend(s);Family;Available PRN/intermittently Type of Home: House Home Access: Stairs to enter [employer] of Steps: 1 Entrance Stairs-Rails: None Home Layout: One level     Bathroom Shower/Tub: Chief Strategy Officer: Handicapped height     Home Equipment: Agricultural consultant (2 wheels)          Prior Functioning/Environment Prior Level of Function : Independent/Modified Independent             Mobility Comments: MOD I household distances with RW, limited community distances; hx of 2 falls in teh last 6 months ADLs Comments: MOD I with ADL, bathes at sink level        OT Problem List: Decreased strength;Decreased activity tolerance;Decreased knowledge of use of DME or AE;Impaired balance (sitting and/or standing);Decreased knowledge of precautions      OT Treatment/Interventions: Self-care/ADL training;Patient/family education;DME and/or AE instruction;Energy conservation;Balance training;Therapeutic exercise    OT Goals(Current goals can be found in the care plan section) Acute Rehab OT Goals Patient Stated Goal: get back home after rehab OT Goal Formulation: With patient Time For  Goal Achievement: 08/23/21 Potential to Achieve Goals: Good ADL Goals Pt Will Perform Grooming: with modified independence;sitting Pt Will Perform Upper Body Dressing: with modified independence Pt Will Perform Lower Body Dressing: with modified independence;sit to/from stand Pt Will Transfer to Toilet: with modified independence;stand pivot transfer;bedside commode Pt Will Perform Toileting - Clothing Manipulation and hygiene: with modified independence  OT Frequency: Min 2X/week    Co-evaluation              AM-PAC OT "6 Clicks" Daily Activity     Outcome Measure Help from another person eating meals?: None Help from another person taking care of personal grooming?: None Help from another person toileting, which includes using toliet, bedpan, or urinal?: A Little Help from another person bathing (including washing, rinsing, drying)?: A Lot Help from another person to put on and taking off regular upper body clothing?: A Little Help from another person to put on and taking off regular lower body clothing?: A Lot 6 Click Score: 18   End of Session Equipment Utilized During Treatment: Gait belt;Rolling walker (2 wheels);Left knee immobilizer Nurse Communication:  Mobility status  Activity Tolerance: Patient tolerated treatment well Patient left: in bed;with call bell/phone within reach;with bed alarm set  OT Visit Diagnosis: Unsteadiness on feet (R26.81);History of falling (Z91.81);Muscle weakness (generalized) (M62.81)                Time: 9735-3299 OT Time Calculation (min): 26 min Charges:  OT General Charges $OT Visit: 1 Visit OT Evaluation $OT Eval Moderate Complexity: 1 Mod OT Treatments $Self Care/Home Management : 8-22 mins Oleta Mouse, OTD OTR/L  08/09/21, 1:10 PM

## 2021-08-09 NOTE — Progress Notes (Signed)
  Echocardiogram 2D Echocardiogram has been performed.  Leta Jungling M 08/09/2021, 9:48 AM

## 2021-08-09 NOTE — Progress Notes (Signed)
Patient ID: Erica Perry, female   DOB: 04/08/1924, 88 y.o.   MRN: [id number]  Subjective: No new complaints.  The patient was pleased with her ability to get up and take a few small steps with physical therapy yesterday afternoon.   Objective: Vital signs in last 24 hours: Temp:  [97.5 F (36.4 C)-97.9 F (36.6 C)] 97.5 F (36.4 C) (06/01 0826) Pulse Rate:  [58-78] 76 (06/01 0826) Resp:  [16-18] 18 (06/01 0826) BP: (104-158)/(67-94) 158/94 (06/01 0826) SpO2:  [94 %-99 %] 98 % (06/01 0826)  Intake/Output from previous day: No intake/output data recorded. Intake/Output this shift: Total I/O In: 120 [P.O.:120] Out: -   Recent Labs    08/07/21 2019 08/08/21 1140 08/09/21 0437  HGB 11.6* 11.5* 10.3*   Recent Labs    08/08/21 1140 08/09/21 0437  WBC 9.1 8.2  RBC 3.85* 3.52*  HCT 35.4* 32.0*  PLT 261 257   Recent Labs    08/08/21 1140 08/09/21 0437  NA 125* 130*  K 4.2 4.5  CL 89* 94*  CO2 28 32  BUN 8 10  CREATININE 0.38* 0.46  GLUCOSE 95 85  CALCIUM 8.2* 8.2*   No results for input(s): LABPT, INR in the last 72 hours.  Physical Exam: Orthopedic examination again is limited to the left knee and lower extremity.  The findings are unchanged as compared to yesterday.  She remains neurovascularly intact to the left lower extremity and foot.  Assessment: Insufficiency/impaction fracture of medial tibial plateau, left knee.  Plan: The patient may continue to be mobilized with physical therapy, toe-touch weightbearing to a maximum of partial weightbearing on the left leg, so long as the left lower extremity is in a knee immobilizer and using a walker for balance and support.  I agree with the plans for skilled nursing placement until her fracture heals sufficiently in order to enable her to return home safely.  Once again, thank you for asking me to participate in the care of this most delightful woman.  I will sign off at this time.  If you have further need of  orthopedic input during this hospitalization, please reconsult me.  We will plan on seeing [name] back in the office in approximately 1 month.   Excell Seltzer Dennies Coate 08/09/2021, 1:49 PM

## 2021-08-09 NOTE — Progress Notes (Signed)
[facility]  - [place] [facility]   PATIENT NAME: Erica Perry    MR#:  [id number]  DATE OF BIRTH:  1925/03/09  SUBJECTIVE:   patient seen earlier. No family at bedside. Very alert and oriented. Eating a cookie. Denies any new complaints other than some pain on their left knee.   VITALS:  Blood pressure (!) 158/94, pulse 76, temperature (!) 97.5 F (36.4 C), temperature source Oral, resp. rate 18, height 5\' 3"  (1.6 m), weight 43.1 kg, SpO2 98 %.  PHYSICAL EXAMINATION:   GENERAL:  88 y.o.-year-old patient lying in the bed with no acute distress.  LUNGS: Normal breath sounds bilaterally, no wheezing, rales, rhonchi.  CARDIOVASCULAR: S1, S2 normal. No murmurs, rubs, or gallops.  ABDOMEN: Soft, nontender, nondistended. Bowel sounds present.  EXTREMITIES: left knee immobilizer  NEUROLOGIC: nonfocal  patient is alert and awake SKIN: No obvious rash, lesion, or ulcer.   LABORATORY PANEL:  CBC Recent Labs  Lab 08/09/21 0437  WBC 8.2  HGB 10.3*  HCT 32.0*  PLT 257    Chemistries  Recent Labs  Lab 08/07/21 2019 08/08/21 1140 08/09/21 0437  NA 126* 125* 130*  K 4.4 4.2 4.5  CL 90* 89* 94*  CO2 26 28 32  GLUCOSE 114* 95 85  BUN 10 8 10   CREATININE 0.48 0.38* 0.46  CALCIUM 8.6* 8.2* 8.2*  MG  --  2.2  --   AST 32  --   --   ALT 27  --   --   ALKPHOS 81  --   --   BILITOT 0.6  --   --    Cardiac Enzymes No results for input(s): TROPONINI in the last 168 hours. RADIOLOGY:  DG Chest 1 View  Result Date: 08/07/2021 CLINICAL DATA:  Multiple falls EXAM: CHEST  1 VIEW COMPARISON:  03/09/2016 FINDINGS: Single frontal view of the chest demonstrates a stable cardiac silhouette. Chronic ectasia and atherosclerosis of the thoracic aorta. No acute airspace disease, effusion, or pneumothorax. No acute displaced fracture. IMPRESSION: 1. No acute intrathoracic process. Electronically Signed   By: 08/09/2021 M.D.   On: 08/07/2021 19:17   DG Pelvis 1-2  Views  Result Date: 08/07/2021 CLINICAL DATA:  Worsening left knee and hip pain after fall EXAM: PELVIS - 1-2 VIEW COMPARISON:  None Available. FINDINGS: Supine frontal view of the pelvis was performed. No acute fracture, subluxation, or dislocation. Moderate symmetrical bilateral hip osteoarthritis. Sacroiliac joints are unremarkable. Soft tissues are grossly normal. IMPRESSION: 1. Symmetrical bilateral hip osteoarthritis.  No acute fracture. Electronically Signed   By: 08/09/2021 M.D.   On: 08/07/2021 15:17   DG Chest Portable 1 View  Result Date: 08/08/2021 CLINICAL DATA:  Mild hypoxia rest EXAM: PORTABLE CHEST 1 VIEW COMPARISON:  Chest x-ray dated Aug 07, 2021 FINDINGS: Cardiac and mediastinal contours are unchanged. Unchanged interstitial opacities which are most pronounced in the upper lungs. No focal consolidation. No large pleural effusion or pneumothorax. IMPRESSION: Unchanged interstitial opacities which are most pronounced in the upper lungs, differential considerations include pulmonary edema or underlying chronic lung disease. No focal consolidation. Electronically Signed   By: 08/10/2021 M.D.   On: 08/08/2021 08:39   DG Knee Complete 4 Views Left  Result Date: 08/07/2021 CLINICAL DATA:  Worsening left knee and hip pain, multiple falls EXAM: LEFT KNEE - COMPLETE 4+ VIEW COMPARISON:  07/16/2021 FINDINGS: Frontal, bilateral oblique, and cross-table lateral views of the left knee are obtained. Bones are diffusely osteopenic.  Since the previous exam, linear sclerosis has developed within the medial tibial plateau perpendicular to the trabecular lines, with subtle buckling of the medial tibial metaphyseal cortex. Findings are consistent with insufficiency fracture. No other acute bony abnormalities. Stable mild 3 compartmental osteoarthritis. Trace left knee effusion has developed. There is diffuse atherosclerosis. IMPRESSION: 1. Minimally impacted insufficiency fracture involving the  medial tibial metaphysis, with slight buckling of the cortex and linear sclerosis in the medial tibial plateau as above. This has developed in the interim since prior exams. 2. Trace left knee effusion. Electronically Signed   By: Sharlet Salina M.D.   On: 08/07/2021 15:20   ECHOCARDIOGRAM COMPLETE  Result Date: 08/09/2021    ECHOCARDIOGRAM REPORT   Patient Name:   Erica Perry Date of Exam: 08/09/2021 Medical Rec #:  [id number]        Height:       63.0 in Accession #:    [id number]       Weight:       95.0 lb Date of Birth:  10-Apr-1924        BSA:          1.409 m Patient Age:    88 years         BP:           158/94 mmHg Patient Gender: F                HR:           73 bpm. Exam Location:  Inpatient Procedure: 2D Echo, Cardiac Doppler and Color Doppler Indications:     Dyspnea R06.00  History:         Patient has prior history of Echocardiogram examinations, most                  recent 12/16/2019. COPD; Risk Factors:Former Smoker,                  Hypertension and Dyslipidemia.  Sonographer:     Leta Jungling RDCS Referring Phys:  [id number] Verdia Kuba Diagnosing Phys: Yvonne Kendall MD IMPRESSIONS  1. Left ventricular ejection fraction, by estimation, is 60 to 65%. The left ventricle has normal function. The left ventricle has no regional wall motion abnormalities. Left ventricular diastolic parameters are consistent with Grade II diastolic dysfunction (pseudonormalization). Elevated left atrial pressure.  2. Right ventricular systolic function is normal. The right ventricular size is normal. There is moderately elevated pulmonary artery systolic pressure.  3. Left atrial size was severely dilated.  4. Right atrial size was mildly dilated.  5. A small pericardial effusion is present. The pericardial effusion is anterior to the right ventricle.  6. The mitral valve is degenerative. Mild to moderate mitral valve regurgitation. No evidence of mitral stenosis. There is mild late systolic prolapse of  posterior leaflet of the mitral valve.  7. The tricuspid valve is degenerative. Tricuspid valve regurgitation is moderate to severe.  8. The aortic valve is tricuspid. There is moderate calcification of the aortic valve. There is mild thickening of the aortic valve. Aortic valve regurgitation is trivial. Aortic valve sclerosis/calcification is present, without any evidence of aortic stenosis. Aortic valve mean gradient measures 3.5 mmHg.  9. There is mild dilatation of the ascending aorta, measuring 37 mm. 10. The inferior vena cava is normal in size with <50% respiratory variability, suggesting right atrial pressure of 8 mmHg. FINDINGS  Left Ventricle: Left ventricular ejection fraction, by estimation, is 60 to 65%. The  left ventricle has normal function. The left ventricle has no regional wall motion abnormalities. The left ventricular internal cavity size was normal in size. There is  no left ventricular hypertrophy. Left ventricular diastolic parameters are consistent with Grade II diastolic dysfunction (pseudonormalization). Elevated left atrial pressure. Right Ventricle: The right ventricular size is normal. No increase in right ventricular wall thickness. Right ventricular systolic function is normal. There is moderately elevated pulmonary artery systolic pressure. The tricuspid regurgitant velocity is 3.42 m/s, and with an assumed right atrial pressure of 8 mmHg, the estimated right ventricular systolic pressure is 54.8 mmHg. Left Atrium: Left atrial size was severely dilated. Right Atrium: Right atrial size was mildly dilated. Pericardium: A small pericardial effusion is present. The pericardial effusion is anterior to the right ventricle. Presence of epicardial fat layer. Mitral Valve: The mitral valve is degenerative in appearance. There is mild late systolic prolapse of posterior leaflet of the mitral valve. Mild mitral annular calcification. Mild to moderate mitral valve regurgitation. No evidence of  mitral valve stenosis. Tricuspid Valve: The tricuspid valve is degenerative in appearance. Tricuspid valve regurgitation is moderate to severe. Aortic Valve: The aortic valve is tricuspid. There is moderate calcification of the aortic valve. There is mild thickening of the aortic valve. Aortic valve regurgitation is trivial. Aortic valve sclerosis/calcification is present, without any evidence of aortic stenosis. Aortic valve mean gradient measures 3.5 mmHg. Aortic valve peak gradient measures 6.5 mmHg. Pulmonic Valve: The pulmonic valve was not well visualized. Pulmonic valve regurgitation is not visualized. No evidence of pulmonic stenosis. Aorta: The aortic root is normal in size and structure. There is mild dilatation of the ascending aorta, measuring 37 mm. Pulmonary Artery: The pulmonary artery is of normal size. Venous: The inferior vena cava is normal in size with less than 50% respiratory variability, suggesting right atrial pressure of 8 mmHg. IAS/Shunts: The interatrial septum was not well visualized.  LEFT VENTRICLE PLAX 2D LVIDd:         3.70 cm   Diastology LVIDs:         2.50 cm   LV e' medial:    30.44 cm/s LV PW:         0.80 cm   LV E/e' medial:  1.9 LV IVS:        0.80 cm   LV e' lateral:   5.66 cm/s LVOT diam:     2.00 cm   LV E/e' lateral: 10.1 LV SV:         47 LV SV Index:   34 LVOT Area:     3.14 cm  RIGHT VENTRICLE RV S prime:     12.70 cm/s TAPSE (M-mode): 1.2 cm LEFT ATRIUM              Index LA diam:        3.80 cm  2.70 cm/m LA Vol (A2C):   61.5 ml  43.64 ml/m LA Vol (A4C):   105.0 ml 74.51 ml/m LA Biplane Vol: 79.6 ml  56.49 ml/m  AORTIC VALVE AV Area (Vmax): 1.79 cm AV Vmax:        127.00 cm/s AV Peak Grad:   6.5 mmHg AV Mean Grad:   3.5 mmHg LVOT Vmax:      72.50 cm/s LVOT Vmean:     49.000 cm/s LVOT VTI:       0.151 m  AORTA Ao Root diam: 3.00 cm Ao Asc diam:  3.70 cm MITRAL VALVE  TRICUSPID VALVE MV Area (PHT): 3.77 cm    TR Peak grad:   46.8 mmHg MV Decel Time:  201 msec    TR Vmax:        342.00 cm/s MV E velocity: 57.00 cm/s MV A velocity: 49.30 cm/s  SHUNTS MV E/A ratio:  1.16        Systemic VTI:  0.15 m                            Systemic Diam: 2.00 cm Yvonne Kendall MD Electronically signed by Yvonne Kendall MD Signature Date/Time: 08/09/2021/12:50:37 PM    Final     Assessment and Plan  Erica Perry is a 88 y.o. female with medical history significant of hypothyroidism, hypertension, hyperlipidemia, COPD not on chronic oxygen, former tobacco use, chronic benzodiazepine use on Ativan, GERD, ambulatory dysfunction uses a walker, osteoarthritis.  patient presents to the emergency department after having recurrent falls at home.  Left knee fracture status post mechanical fall  Imaging shows minimally impacted insufficiency fracture involving the medial tibial metaphysis, with slight buckling of the cortex and linears clerosis in the medial tibial plateau as above.  --PT/OT consult-- recommends rehab -- [facility] consulted for placement.  --consult with Ortho. Dr. Joice Lofts recommended knee immobilizer and limited weight bearing with walker. -- PRN pain meds   Recurrent falls:  --Appears to be mechanical.   --Fall precautions ordered.  --CT head on 5/28 shows right posterior scalp hematoma.  --Hold DVT PPX.   Hyponatremia: Likely due to low solute intake.  --Asymptomatic. Sodium 125-- IV fluids. Sodium is 130. Discontinue IV fluids. -- Previous sodium 131-134 on Care Everywhere.    Acute hypoxic respiratory failure: Mild. Now on 2L. BNP elevated at 331. Previous ECHO on 03/2019 showed LVEF 55-60 --patient denies any shortness of breath.-- Respiratory failure resolved.  -- Wean to room air  Hypothyroidism: Continue home Synthroid.     Chronic benzodiazepine use: Continue home Ativan   Hypertension: Continue home Cozaar and Coreg   Hyperlipidemia: Continue home Zocor   GERD: Continue home Pepcid   overall hemodynamically stable. TOC for  discharge planning   Procedures: none Family communication : left message for daughter [name] Consults : orthopedic CODE STATUS: DNR DVT Prophylaxis : SCD Level of care: Med-Surg Status is: Inpatient Remains inpatient appropriate because: awaiting placement    TOTAL TIME TAKING CARE OF THIS PATIENT: 35 minutes.  >50% time spent on counselling and coordination of care  Note: This dictation was prepared with Dragon dictation along with smaller phrase technology. Any transcriptional errors that result from this process are unintentional.  Enedina Finner M.D    [facility]   CC: Primary care physician; Gracelyn Nurse, MD

## 2021-08-09 NOTE — Progress Notes (Addendum)
Physical Therapy Treatment Patient Details Name: Erica Perry MRN: [id number] DOB: 14-Mar-1924 Today's Date: 08/09/2021   History of Present Illness Pt is a 88 y.o. female with medical history significant of hypothyroidism, hypertension, hyperlipidemia, COPD not on chronic oxygen, former tobacco use, chronic benzodiazepine use on Ativan, GERD, ambulatory dysfunction uses a walker, osteoarthritis.  This patient presents to the emergency department after having recurrent falls at home. MD assessment includes: Insufficiency/impaction fracture medial tibial plateau left knee, recurrent falls, hyponatremia, and acute hypoxic respiratory failure.    PT Comments    Pt was long sitting in bed upon arriving. She is A and O x 4 and agreeable to session. Pt is very motivated to improve so she can return to her own apartment. She was able to exit L side of bed, stand to RW, and pivot to recliner. Does well maintaining TTWB overall.Author attempted to wean from O2 however pt quickly desaturated to low 80s. Reapplied O2 and she was able to recover to > 90%. Pt was issued HEP while sitting in recliner and performed several until lunch tray arrived. Overall pt is progressing towards all PT goals. Rehab at [facility] still most appropriate.    Recommendations for follow up therapy are one component of a multi-disciplinary discharge planning process, led by the attending physician.  Recommendations may be updated based on patient status, additional functional criteria and insurance authorization.  Follow Up Recommendations  Skilled nursing-short term rehab (<3 hours/day)     Assistance Recommended at Discharge Frequent or constant Supervision/Assistance  Patient can return home with the following Two people to help with walking and/or transfers;A lot of help with bathing/dressing/bathroom;Assistance with cooking/housework;Direct supervision/assist for medications management;Help with stairs or ramp for entrance;Assist  for transportation   Equipment Recommendations  Other (comment) (defer to next level of care)       Precautions / Restrictions Precautions Precautions: Fall Required Braces or Orthoses: Knee Immobilizer - Left Restrictions Weight Bearing Restrictions: Yes LLE Weight Bearing: Partial weight bearing Other Position/Activity Restrictions: Per Ortho MD note, TTWB to max of PWB with KI donned to LLE     Mobility  Bed Mobility Overal bed mobility: Needs Assistance Bed Mobility: Supine to Sit  Supine to sit: Min assist  General bed mobility comments: HOB was elevated, Increased time to perform.,  Transfers Overall transfer level: Needs assistance Equipment used: Rolling walker (2 wheels) Transfers: Sit to/from Stand Sit to Stand: From elevated surface, Min assist  Ambulation/Gait Ambulation/Gait assistance: Min Chemical engineer (Feet): 2 Feet Assistive device: Rolling walker (2 wheels) Gait Pattern/deviations: Shuffle Gait velocity: decreased  General Gait Details: pt was able to stand and take a few shuffling pivot steps to recliner. does well with limiting wt but gets fatigue with limited standing time.     Balance Overall balance assessment: Needs assistance, History of Falls Sitting-balance support: Feet unsupported, Single extremity supported Sitting balance-Leahy Scale: Good     Standing balance support: Bilateral upper extremity supported, During functional activity, Reliant on assistive device for balance Standing balance-Leahy Scale: Fair       Cognition Arousal/Alertness: Awake/alert Behavior During Therapy: WFL for tasks assessed/performed Overall Cognitive Status: Within Functional Limits for tasks assessed    General Comments: Pt is A and O x 4           General Comments General comments (skin integrity, edema, etc.): Chartered loss adjuster issued HEP handout and pt performed with cueing. would benefit form re-education on ther ex next session.      Pertinent  Vitals/Pain Pain Assessment Pain Assessment: 0-10 Pain Score: 3  Faces Pain Scale: Hurts a little bit Pain Location: L knee Pain Descriptors / Indicators: Sore, Aching Pain Intervention(s): Monitored during session, Limited activity within patient's tolerance, Premedicated before session    Home Living Family/patient expects to be discharged to:: Private residence Living Arrangements: Alone Available Help at Discharge: Friend(s);Family;Available PRN/intermittently Type of Home: House Home Access: Stairs to enter Entrance Stairs-Rails: None Entrance Stairs-Number of Steps: 1   Home Layout: One level Home Equipment: Agricultural consultant (2 wheels)          PT Goals (current goals can now be found in the care plan section) Acute Rehab PT Goals Patient Stated Goal: To get back to living at home again Progress towards PT goals: Progressing toward goals    Frequency    7X/week      PT Plan Current plan remains appropriate       AM-PAC PT "6 Clicks" Mobility   Outcome Measure  Help needed turning from your back to your side while in a flat bed without using bedrails?: A Little Help needed moving from lying on your back to sitting on the side of a flat bed without using bedrails?: A Little Help needed moving to and from a bed to a chair (including a wheelchair)?: A Little Help needed standing up from a chair using your arms (e.g., wheelchair or bedside chair)?: A Little Help needed to walk in hospital room?: Total Help needed climbing 3-5 steps with a railing? : Total 6 Click Score: 14    End of Session Equipment Utilized During Treatment: Oxygen (desaturated to 83% on rm air. reapplied 2 L and was able to recover to 90s) Activity Tolerance: Patient tolerated treatment well Patient left: in chair;with call bell/phone within reach;with chair alarm set;with family/visitor present Nurse Communication: Mobility status;Weight bearing status;Other (comment) PT Visit Diagnosis:  History of falling (Z91.81);Unsteadiness on feet (R26.81);Difficulty in walking, not elsewhere classified (R26.2);Muscle weakness (generalized) (M62.81);Pain Pain - Right/Left: Left Pain - part of body: Knee     Time: 5284-1324 PT Time Calculation (min) (ACUTE ONLY): 24 min  Charges:  $Therapeutic Exercise: 8-22 mins $Therapeutic Activity: 8-22 mins                     Jetta Lout PTA 08/09/21, 1:39 PM

## 2021-08-10 DIAGNOSIS — E871 Hypo-osmolality and hyponatremia: Secondary | ICD-10-CM | POA: Diagnosis not present

## 2021-08-10 NOTE — Progress Notes (Signed)
[facility] [place] NAME: Erica Perry    MR#:  [id number]  DATE OF BIRTH:  Jun 18, 1924  SUBJECTIVE:  patient working with physical therapy. Denies any complaints. No family at bedside. No issues per RN.  VITALS:  Blood pressure (!) 159/92, pulse 78, temperature 97.8 F (36.6 C), temperature source Oral, resp. rate 16, height 5\' 3"  (1.6 m), weight 43.1 kg, SpO2 93 %.  PHYSICAL EXAMINATION:   GENERAL:  88 y.o.-year-old patient lying in the bed with no acute distress.  LUNGS: Normal breath sounds bilaterally, no wheezing, rales, rhonchi.  CARDIOVASCULAR: S1, S2 normal. No murmurs, rubs, or gallops.  ABDOMEN: Soft, nontender, nondistended. Bowel sounds present.  EXTREMITIES: left knee immobilizer  NEUROLOGIC: nonfocal  patient is alert and awake SKIN: No obvious rash, lesion, or ulcer.   LABORATORY PANEL:  CBC Recent Labs  Lab 08/09/21 0437  WBC 8.2  HGB 10.3*  HCT 32.0*  PLT 257     Chemistries  Recent Labs  Lab 08/07/21 2019 08/08/21 1140 08/09/21 0437  NA 126* 125* 130*  K 4.4 4.2 4.5  CL 90* 89* 94*  CO2 26 28 32  GLUCOSE 114* 95 85  BUN 10 8 10   CREATININE 0.48 0.38* 0.46  CALCIUM 8.6* 8.2* 8.2*  MG  --  2.2  --   AST 32  --   --   ALT 27  --   --   ALKPHOS 81  --   --   BILITOT 0.6  --   --     Cardiac Enzymes No results for input(s): TROPONINI in the last 168 hours. RADIOLOGY:  ECHOCARDIOGRAM COMPLETE  Result Date: 08/09/2021    ECHOCARDIOGRAM REPORT   Patient Name:   Erica Perry Date of Exam: 08/09/2021 Medical Rec #:  [id number]        Height:       63.0 in Accession #:    [id number]       Weight:       95.0 lb Date of Birth:  11-10-1924        BSA:          1.409 m Patient Age:    56 years         BP:           158/94 mmHg Patient Gender: F                HR:           73 bpm. Exam Location:  Inpatient Procedure: 2D Echo, Cardiac Doppler and Color Doppler Indications:     Dyspnea R06.00  History:          Patient has prior history of Echocardiogram examinations, most                  recent 12/16/2019. COPD; Risk Factors:Former Smoker,                  Hypertension and Dyslipidemia.  Sonographer:     Darlina Sicilian RDCS Referring Phys:  [place] Diagnosing Phys: Nelva Bush MD IMPRESSIONS  1. Left ventricular ejection fraction, by estimation, is 60 to 65%. The left ventricle has normal function. The left ventricle has no regional wall motion abnormalities. Left ventricular diastolic parameters are consistent with Grade II diastolic dysfunction (pseudonormalization). Elevated left atrial pressure.  2. Right ventricular systolic function is normal. The right ventricular size is normal. There is moderately elevated pulmonary  artery systolic pressure.  3. Left atrial size was severely dilated.  4. Right atrial size was mildly dilated.  5. A small pericardial effusion is present. The pericardial effusion is anterior to the right ventricle.  6. The mitral valve is degenerative. Mild to moderate mitral valve regurgitation. No evidence of mitral stenosis. There is mild late systolic prolapse of posterior leaflet of the mitral valve.  7. The tricuspid valve is degenerative. Tricuspid valve regurgitation is moderate to severe.  8. The aortic valve is tricuspid. There is moderate calcification of the aortic valve. There is mild thickening of the aortic valve. Aortic valve regurgitation is trivial. Aortic valve sclerosis/calcification is present, without any evidence of aortic stenosis. Aortic valve mean gradient measures 3.5 mmHg.  9. There is mild dilatation of the ascending aorta, measuring 37 mm. 10. The inferior vena cava is normal in size with <50% respiratory variability, suggesting right atrial pressure of 8 mmHg. FINDINGS  Left Ventricle: Left ventricular ejection fraction, by estimation, is 60 to 65%. The left ventricle has normal function. The left ventricle has no regional wall motion abnormalities.  The left ventricular internal cavity size was normal in size. There is  no left ventricular hypertrophy. Left ventricular diastolic parameters are consistent with Grade II diastolic dysfunction (pseudonormalization). Elevated left atrial pressure. Right Ventricle: The right ventricular size is normal. No increase in right ventricular wall thickness. Right ventricular systolic function is normal. There is moderately elevated pulmonary artery systolic pressure. The tricuspid regurgitant velocity is 3.42 m/s, and with an assumed right atrial pressure of 8 mmHg, the estimated right ventricular systolic pressure is [id number] mmHg. Left Atrium: Left atrial size was severely dilated. Right Atrium: Right atrial size was mildly dilated. Pericardium: A small pericardial effusion is present. The pericardial effusion is anterior to the right ventricle. Presence of epicardial fat layer. Mitral Valve: The mitral valve is degenerative in appearance. There is mild late systolic prolapse of posterior leaflet of the mitral valve. Mild mitral annular calcification. Mild to moderate mitral valve regurgitation. No evidence of mitral valve stenosis. Tricuspid Valve: The tricuspid valve is degenerative in appearance. Tricuspid valve regurgitation is moderate to severe. Aortic Valve: The aortic valve is tricuspid. There is moderate calcification of the aortic valve. There is mild thickening of the aortic valve. Aortic valve regurgitation is trivial. Aortic valve sclerosis/calcification is present, without any evidence of aortic stenosis. Aortic valve mean gradient measures 3.5 mmHg. Aortic valve peak gradient measures 6.5 mmHg. Pulmonic Valve: The pulmonic valve was not well visualized. Pulmonic valve regurgitation is not visualized. No evidence of pulmonic stenosis. Aorta: The aortic root is normal in size and structure. There is mild dilatation of the ascending aorta, measuring 37 mm. Pulmonary Artery: The pulmonary artery is of normal size.  Venous: The inferior vena cava is normal in size with less than 50% respiratory variability, suggesting right atrial pressure of 8 mmHg. IAS/Shunts: The interatrial septum was not well visualized.  LEFT VENTRICLE PLAX 2D LVIDd:         3.70 cm   Diastology LVIDs:         2.50 cm   LV e' medial:    30.44 cm/s LV PW:         0.80 cm   LV E/e' medial:  1.9 LV IVS:        0.80 cm   LV e' lateral:   5.66 cm/s LVOT diam:     2.00 cm   LV E/e' lateral: 10.1 LV SV:  47 LV SV Index:   34 LVOT Area:     3.14 cm  RIGHT VENTRICLE RV S prime:     12.70 cm/s TAPSE (M-mode): 1.2 cm LEFT ATRIUM              Index LA diam:        3.80 cm  2.70 cm/m LA Vol (A2C):   61.5 ml  43.64 ml/m LA Vol (A4C):   105.0 ml 74.51 ml/m LA Biplane Vol: 79.6 ml  56.49 ml/m  AORTIC VALVE AV Area (Vmax): 1.79 cm AV Vmax:        127.00 cm/s AV Peak Grad:   6.5 mmHg AV Mean Grad:   3.5 mmHg LVOT Vmax:      72.50 cm/s LVOT Vmean:     49.000 cm/s LVOT VTI:       0.151 m  AORTA Ao Root diam: 3.00 cm Ao Asc diam:  3.70 cm MITRAL VALVE               TRICUSPID VALVE MV Area (PHT): 3.77 cm    TR Peak grad:   46.8 mmHg MV Decel Time: 201 msec    TR Vmax:        342.00 cm/s MV E velocity: 57.00 cm/s MV A velocity: 49.30 cm/s  SHUNTS MV E/A ratio:  1.16        Systemic VTI:  0.15 m                            Systemic Diam: 2.00 cm Yvonne Kendall MD Electronically signed by Yvonne Kendall MD Signature Date/Time: 08/09/2021/12:50:37 PM    Final     Assessment and Plan  Erica Perry is a 88 y.o. female with medical history significant of hypothyroidism, hypertension, hyperlipidemia, COPD not on chronic oxygen, former tobacco use, chronic benzodiazepine use on Ativan, GERD, ambulatory dysfunction uses a walker, osteoarthritis.  patient presents to the emergency department after having recurrent falls at home.  Left knee fracture status post mechanical fall  Imaging shows minimally impacted insufficiency fracture involving the medial tibial  metaphysis, with slight buckling of the cortex and linears clerosis in the medial tibial plateau as above.  --PT/OT consult-- recommends rehab -- [facility] consulted for [facility].  --consult with Ortho. Dr. Joice Lofts recommended knee immobilizer and limited weight bearing with walker. -- PRN pain meds   Recurrent falls:  --Appears to be mechanical.   --Fall precautions ordered.  --CT head on 5/28 shows right posterior scalp hematoma.  --Hold DVT PPX.   Hyponatremia: Likely due to low solute intake.  --Asymptomatic. Sodium 125-- IV fluids. Sodium is 130. Discontinue IV fluids. -- Previous sodium 131-134 on Care Everywhere.    Acute hypoxic respiratory failure: Mild. Now on 2L. BNP elevated at 331. Previous ECHO on 03/2019 showed LVEF 55-60 --patient denies any shortness of breath.-- Respiratory failure resolved.  -- Wean to room air  Hypothyroidism: Continue home Synthroid.     Chronic benzodiazepine use: Continue home Ativan   Hypertension: Continue home Cozaar and Coreg   Hyperlipidemia: Continue home Zocor   GERD: Continue home Pepcid   overall hemodynamically stable. TOC for discharge planning to rehab once bed available   Procedures: none Family communication : spoke with daughter [name] Consults : orthopedic CODE STATUS: DNR DVT Prophylaxis : SCD Level of care: Med-Surg Status is: Inpatient Remains inpatient appropriate because: awaiting placement    TOTAL TIME TAKING CARE OF THIS PATIENT: 35  minutes.  >50% time spent on counselling and coordination of care  Note: This dictation was prepared with Dragon dictation along with smaller phrase technology. Any transcriptional errors that result from this process are unintentional.  Fritzi Mandes M.D    [facility]   CC: Primary care physician; Baxter Hire, MD

## 2021-08-10 NOTE — Progress Notes (Signed)
Occupational Therapy Treatment Patient Details Name: Erica Perry MRN: [id number] DOB: 1924/05/27 Today's Date: 08/10/2021   History of present illness Pt is a 88 y.o. female with medical history significant of hypothyroidism, hypertension, hyperlipidemia, COPD not on chronic oxygen, former tobacco use, chronic benzodiazepine use on Ativan, GERD, ambulatory dysfunction uses a walker, osteoarthritis.  This patient presents to the emergency department after having recurrent falls at home. MD assessment includes: Insufficiency/impaction fracture medial tibial plateau left knee, recurrent falls, hyponatremia, and acute hypoxic respiratory failure.   OT comments  Chart reviewed, pt greeted in room agreeable to OT tx session. Tx session targeted progressing functional mobility in order to improve safe ADL performance. Improvements noted in bed mobility requiring supervision with HOB raised, STS with MIN A-CGA. Toileting completed with supervision. Pt is left in chair, NAD, all needs met. OT will continue to follow.    Recommendations for follow up therapy are one component of a multi-disciplinary discharge planning process, led by the attending physician.  Recommendations may be updated based on patient status, additional functional criteria and insurance authorization.    Follow Up Recommendations  Skilled nursing-short term rehab (<3 hours/day)    Assistance Recommended at Discharge Frequent or constant Supervision/Assistance  Patient can return home with the following  A lot of help with walking and/or transfers;A lot of help with bathing/dressing/bathroom   Equipment Recommendations  Other (comment) (per next venue of care)    Recommendations for Other Services      Precautions / Restrictions Precautions Precautions: Fall Required Braces or Orthoses: Knee Immobilizer - Left Restrictions Weight Bearing Restrictions: Yes LLE Weight Bearing: Partial weight bearing Other Position/Activity  Restrictions: Per Ortho MD note, TTWB to max of PWB with KI donned to LLE       Mobility Bed Mobility                    Transfers                         Balance Overall balance assessment: Needs assistance, History of Falls Sitting-balance support: Feet unsupported, Single extremity supported Sitting balance-Leahy Scale: Good     Standing balance support: Bilateral upper extremity supported, During functional activity, Reliant on assistive device for balance Standing balance-Leahy Scale: Fair                             ADL either performed or assessed with clinical judgement   ADL Overall ADL's : Needs assistance/impaired                                       General ADL Comments: supine>sit with SUP with HOB raised, STS with MIN A with low bed, CGA with raised bed with RW, SPT to bedside commode then chair with CGA, toileting with SET UP.    Extremity/Trunk Assessment              Vision       Perception     Praxis      Cognition Arousal/Alertness: Awake/alert Behavior During Therapy: WFL for tasks assessed/performed Overall Cognitive Status: Within Functional Limits for tasks assessed  Exercises      Shoulder Instructions       General Comments vss throughout    Pertinent Vitals/ Pain       Pain Assessment Pain Assessment: 0-10 Pain Score: 7  Pain Location: L knee Pain Descriptors / Indicators: Sore, Aching Pain Intervention(s): Limited activity within patient's tolerance, Monitored during session, Premedicated before session, Repositioned  Home Living                                          Prior Functioning/Environment              Frequency  Min 2X/week        Progress Toward Goals  OT Goals(current goals can now be found in the care plan section)        Plan Discharge plan remains appropriate     Co-evaluation                 AM-PAC OT "6 Clicks" Daily Activity     Outcome Measure   Help from another person eating meals?: None Help from another person taking care of personal grooming?: None Help from another person toileting, which includes using toliet, bedpan, or urinal?: A Little Help from another person bathing (including washing, rinsing, drying)?: A Lot Help from another person to put on and taking off regular upper body clothing?: A Little Help from another person to put on and taking off regular lower body clothing?: A Lot 6 Click Score: 18    End of Session Equipment Utilized During Treatment: Gait belt;Rolling walker (2 wheels);Left knee immobilizer  OT Visit Diagnosis: Unsteadiness on feet (R26.81);History of falling (Z91.81);Muscle weakness (generalized) (M62.81)   Activity Tolerance Patient tolerated treatment well   Patient Left in chair;with call bell/phone within reach;with chair alarm set   Nurse Communication Mobility status        Time: 2230-0979 OT Time Calculation (min): 24 min  Charges: OT General Charges $OT Visit: 1 Visit OT Treatments $Self Care/Home Management : 23-37 mins  Shanon Payor, OTD OTR/L  08/10/21, 12:51 PM

## 2021-08-10 NOTE — Progress Notes (Signed)
Physical Therapy Treatment Patient Details Name: Erica Perry MRN: [id number] DOB: 03-19-1924 Today's Date: 08/10/2021   History of Present Illness Pt is a 88 y.o. female with medical history significant of hypothyroidism, hypertension, hyperlipidemia, COPD not on chronic oxygen, former tobacco use, chronic benzodiazepine use on Ativan, GERD, ambulatory dysfunction uses a walker, osteoarthritis.  This patient presents to the emergency department after having recurrent falls at home. MD assessment includes: Insufficiency/impaction fracture medial tibial plateau left knee, recurrent falls, hyponatremia, and acute hypoxic respiratory failure.       Recommendations for follow up therapy are one component of a multi-disciplinary discharge planning process, led by the attending physician.  Recommendations may be updated based on patient status, additional functional criteria and insurance authorization.  Follow Up Recommendations  Skilled nursing-short term rehab (<3 hours/day)     Assistance Recommended at Discharge Frequent or constant Supervision/Assistance  Patient can return home with the following A lot of help with bathing/dressing/bathroom;Assistance with cooking/housework;Direct supervision/assist for medications management;Help with stairs or ramp for entrance;Assist for transportation;A lot of help with walking and/or transfers   Equipment Recommendations  Other (comment) (Defer to next level of care. May need)    Recommendations for Other Services       Precautions / Restrictions Precautions Precautions: Fall Required Braces or Orthoses: Knee Immobilizer - Left Restrictions Weight Bearing Restrictions: Yes LLE Weight Bearing: Partial weight bearing Other Position/Activity Restrictions: Per Ortho MD note, TTWB to max of PWB with KI donned to LLE     Mobility  Bed Mobility    General bed mobility comments: Pt was sitting in recliner pre/post session     Transfers Overall transfer level: Needs assistance Equipment used: Rolling walker (2 wheels) Transfers: Sit to/from Stand Sit to Stand: From elevated surface, Min assist    General transfer comment: pt performed STS 4 x from recliner with vcs for handplacem,ent and improved techniqu for maintaining PWB. pt puts forth great effort but struggles to maintain. Unable to ambulate greater distances due to inability to perform proper wt bearing.    Ambulation/Gait Ambulation/Gait assistance: Min Chemical engineer (Feet): 3 Feet Assistive device: Rolling walker (2 wheels) Gait Pattern/deviations: Shuffle Gait velocity: decreased     General Gait Details: To/from recliner>< OOB       Balance Overall balance assessment: Needs assistance, History of Falls Sitting-balance support: Feet unsupported, Single extremity supported Sitting balance-Leahy Scale: Good     Standing balance support: Bilateral upper extremity supported, During functional activity, Reliant on assistive device for balance Standing balance-Leahy Scale: Fair      Cognition Arousal/Alertness: Awake/alert Behavior During Therapy: WFL for tasks assessed/performed Overall Cognitive Status: Within Functional Limits for tasks assessed      General Comments: Pt is A and O x 4        Exercises General Exercises - Lower Extremity Ankle Circles/Pumps: AROM Quad Sets: AROM Gluteal Sets: AROM Hip ABduction/ADduction: AROM Straight Leg Raises: AROM    General Comments General comments (skin integrity, edema, etc.): vss throughout      Pertinent Vitals/Pain Pain Assessment Pain Assessment: 0-10 Pain Score: 7  Pain Location: L knee Pain Descriptors / Indicators: Sore, Aching Pain Intervention(s): Limited activity within patient's tolerance, Premedicated before session, Monitored during session, Repositioned     PT Goals (current goals can now be found in the care plan section) Acute Rehab PT Goals Patient  Stated Goal: To get back to living at home again Progress towards PT goals: Progressing toward goals    Frequency  7X/week      PT Plan Current plan remains appropriate       AM-PAC PT "6 Clicks" Mobility   Outcome Measure  Help needed turning from your back to your side while in a flat bed without using bedrails?: A Little Help needed moving from lying on your back to sitting on the side of a flat bed without using bedrails?: A Little Help needed moving to and from a bed to a chair (including a wheelchair)?: A Little Help needed standing up from a chair using your arms (e.g., wheelchair or bedside chair)?: A Little Help needed to walk in hospital room?: Total Help needed climbing 3-5 steps with a railing? : Total 6 Click Score: 14    End of Session Equipment Utilized During Treatment: Oxygen (removed o2 and pt able to maintain > 92% on rm air) Activity Tolerance: Patient tolerated treatment well Patient left: in chair;with call bell/phone within reach;with chair alarm set;with family/visitor present Nurse Communication: Mobility status;Weight bearing status;Other (comment) PT Visit Diagnosis: History of falling (Z91.81);Unsteadiness on feet (R26.81);Difficulty in walking, not elsewhere classified (R26.2);Muscle weakness (generalized) (M62.81);Pain Pain - Right/Left: Left Pain - part of body: Knee     Time: 8938-1017 PT Time Calculation (min) (ACUTE ONLY): 23 min  Charges:  $Therapeutic Exercise: 8-22 mins $Therapeutic Activity: 8-22 mins                     Jetta Lout PTA 08/10/21, 1:41 PM

## 2021-08-10 NOTE — Plan of Care (Signed)
Patient weaned down to 1L O2  from 2 L, O2 sats 93-94%.   Problem: Clinical Measurements: Goal: Respiratory complications will improve Outcome: Progressing   Problem: Pain Managment: Goal: General experience of comfort will improve Outcome: Progressing   Problem: Safety: Goal: Ability to remain free from injury will improve Outcome: Progressing

## 2021-08-10 NOTE — TOC Progression Note (Addendum)
Transition of Care [facility]) - Progression Note    Patient Details  Name: Erica Perry MRN: [id number] Date of Birth: 1925-02-09  Transition of Care [facility]) CM/SW Contact  Liliana Cline, LCSW Phone Number: 08/10/2021, 9:43 AM  Clinical Narrative:   CSW left VM for patient's daughter [name] requesting return call to present bed offers.   11:45- Received VM from daughter [name] while in progression rounds. Attempted another call to [name] to present bed offers, left another VM requesting return call.   11:54- Return call from [facility]. Presented bed offers. She would like to call her sisters then call CSW back.   12:03- 3 way call with patient's 3 daughters Durel Salts, and Roxana. Presented bed offer and Medicare.gov Care Compare ratings. They stated [facility] would be their top choice. They stated [name] can remain the main point of contact and relay updates to other 2 daughters.   CSW called [facility] and spoke with Admissions Worker Logan. She stated they will have a bed and can take patient Sunday so it will be under her Medicare, they cannot take her today under her Medicaid. Whitney Post stated she will let [facility] staff know today that patient will be admitted Sunday so they are prepared. She requested DC be faxed to [phone] at attention to [facility]. Logan stated staff can call main # at [facility] tomorrow to confirm, but can call her directly if there are any issues. Updated MD, RN, and [name].    Expected Discharge Plan: Skilled Nursing Facility Barriers to Discharge: Continued Medical Work up  Expected Discharge Plan and Services Expected Discharge Plan: Skilled Nursing Facility   Discharge Planning Services: CM Consult Post Acute Care Choice: Skilled Nursing Facility Living arrangements for the past 2 months: Apartment                 DME Arranged: N/A DME Agency: NA       HH Arranged: NA HH Agency: NA         Social Determinants of Health  (SDOH) Interventions    Readmission Risk Interventions     View : No data to display.

## 2021-08-11 DIAGNOSIS — E871 Hypo-osmolality and hyponatremia: Secondary | ICD-10-CM | POA: Diagnosis not present

## 2021-08-11 MED ORDER — CARVEDILOL 6.25 MG PO TABS
6.2500 mg | ORAL_TABLET | Freq: Two times a day (BID) | ORAL | Status: DC
  Administered 2021-08-11 – 2021-08-12 (×2): 6.25 mg via ORAL
  Filled 2021-08-11 (×2): qty 1

## 2021-08-11 NOTE — Progress Notes (Signed)
Physical Therapy Treatment Patient Details Name: Erica Perry MRN: [id number] DOB: 1925/02/12 Today's Date: 08/11/2021   History of Present Illness Pt is a 88 y.o. female with medical history significant of hypothyroidism, hypertension, hyperlipidemia, COPD not on chronic oxygen, former tobacco use, chronic benzodiazepine use on Ativan, GERD, ambulatory dysfunction uses a walker, osteoarthritis.  This patient presents to the emergency department after having recurrent falls at home. MD assessment includes: Insufficiency/impaction fracture medial tibial plateau left knee, recurrent falls, hyponatremia, and acute hypoxic respiratory failure.    PT Comments    Pt was long sitting in bed, on rm air, upon arriving. She is alert and oriented. Agrees to session and was cooperative throughout. She continues to require some assistance to exit bed, stand, and pivot to recliner. Pt is limited by ability to maintain proper wt bearing. Knee immobilizer removed once pt was in recliner with gentle AROM/AAROM performed. Replaced/applied knee immobilizer post session.  Pt is progressing well but due to lack of assistance at home + current assistance she requires, Recommend DC to SNF. Acute PT will continue to follow and progress as able per current POC.    Recommendations for follow up therapy are one component of a multi-disciplinary discharge planning process, led by the attending physician.  Recommendations may be updated based on patient status, additional functional criteria and insurance authorization.  Follow Up Recommendations  Skilled nursing-short term rehab (<3 hours/day)     Assistance Recommended at Discharge Frequent or constant Supervision/Assistance  Patient can return home with the following A little help with walking and/or transfers;A little help with bathing/dressing/bathroom;Assistance with cooking/housework;Assistance with feeding;Direct supervision/assist for medications management;Assist  for transportation;Help with stairs or ramp for entrance;Direct supervision/assist for financial management   Equipment Recommendations  Other (comment) (defer to next level of care)       Precautions / Restrictions Precautions Precautions: Fall Required Braces or Orthoses: Knee Immobilizer - Left Knee Immobilizer - Left: On at all times;Other (comment) (except with PT/OT) Restrictions Weight Bearing Restrictions: Yes LLE Weight Bearing: Partial weight bearing Other Position/Activity Restrictions: Per Ortho MD note, TTWB to max of PWB with KI donned to LLE     Mobility  Bed Mobility Overal bed mobility: Needs Assistance Bed Mobility: Supine to Sit  Supine to sit: Min assist  General bed mobility comments: min assist to exit L side of bed. increased time. HOB elevated    Transfers Overall transfer level: Needs assistance Equipment used: Rolling walker (2 wheels) Transfers: Sit to/from Stand Sit to Stand: From elevated surface, Min assist     Ambulation/Gait Ambulation/Gait assistance: Min assist Gait Distance (Feet): 3 Feet Assistive device: Rolling walker (2 wheels) Gait Pattern/deviations: Shuffle Gait velocity: decreased     General Gait Details: To/from recliner>< OOB continues to struggle with proper wt bearing during dynamic standing activity         Cognition Arousal/Alertness: Awake/alert Behavior During Therapy: WFL for tasks assessed/performed Overall Cognitive Status: Within Functional Limits for tasks assessed    General Comments: Extremely pleasant and cooperative. follows commands throughout           General Comments General comments (skin integrity, edema, etc.): Author removed L knee immobilizer and had pt perform several exercises to promote AROM of L knee. Some tightness observed. recommen continued gentle ROM to L knee in future sessions.      Pertinent Vitals/Pain Pain Assessment Pain Assessment: 0-10 Pain Score: 3  Faces Pain Scale:  Hurts a little bit Pain Location: L knee Pain Descriptors /  Indicators: Sore, Aching Pain Intervention(s): Limited activity within patient's tolerance, Monitored during session, Premedicated before session, Repositioned     PT Goals (current goals can now be found in the care plan section) Acute Rehab PT Goals Patient Stated Goal: rehab then return home Progress towards PT goals: Progressing toward goals    Frequency    7X/week      PT Plan Current plan remains appropriate       AM-PAC PT "6 Clicks" Mobility   Outcome Measure  Help needed turning from your back to your side while in a flat bed without using bedrails?: A Little Help needed moving from lying on your back to sitting on the side of a flat bed without using bedrails?: A Little Help needed moving to and from a bed to a chair (including a wheelchair)?: A Little Help needed standing up from a chair using your arms (e.g., wheelchair or bedside chair)?: A Little Help needed to walk in hospital room?: A Lot Help needed climbing 3-5 steps with a railing? : Total 6 Click Score: 15    End of Session Equipment Utilized During Treatment: Other (comment) (pt on rm air during session. did drop to 88 but recovers after seated rest.) Activity Tolerance: Patient tolerated treatment well Patient left: in chair;with call bell/phone within reach;with chair alarm set;with family/visitor present Nurse Communication: Mobility status;Weight bearing status;Other (comment) PT Visit Diagnosis: History of falling (Z91.81);Unsteadiness on feet (R26.81);Difficulty in walking, not elsewhere classified (R26.2);Muscle weakness (generalized) (M62.81);Pain Pain - Right/Left: Left Pain - part of body: Knee     Time: 1610-9604 PT Time Calculation (min) (ACUTE ONLY): 24 min  Charges:  $Therapeutic Exercise: 8-22 mins $Therapeutic Activity: 8-22 mins                     Jetta Lout PTA 08/11/21, 9:43 AM

## 2021-08-11 NOTE — Progress Notes (Signed)
[facility]  - [facility]   PATIENT NAME: Erica Perry    MR#:  [id number]  DATE OF BIRTH:  [birth year]  SUBJECTIVE:  patient working with physical therapy and now in the chair Denies any complaints. No family at bedside. No issues per RN.  VITALS:  Blood pressure (!) 159/92, pulse 74, temperature 98.4 F (36.9 C), temperature source Oral, resp. rate 16, height 5\' 3"  (1.6 m), weight 43.1 kg, SpO2 97 %.  PHYSICAL EXAMINATION:   GENERAL:  88 y.o.-year-old patient lying in the bed with no acute distress.  LUNGS: Normal breath sounds bilaterally, no wheezing, rales, rhonchi.  CARDIOVASCULAR: S1, S2 normal. No murmurs, rubs, or gallops.  ABDOMEN: Soft, nontender, nondistended. Bowel sounds present.  EXTREMITIES: left knee immobilizer  NEUROLOGIC: nonfocal  patient is alert and awake SKIN: No obvious rash, lesion, or ulcer.   LABORATORY PANEL:  CBC Recent Labs  Lab 08/09/21 0437  WBC 8.2  HGB 10.3*  HCT 32.0*  PLT 257     Chemistries  Recent Labs  Lab 08/07/21 2019 08/08/21 1140 08/09/21 0437  NA 126* 125* 130*  K 4.4 4.2 4.5  CL 90* 89* 94*  CO2 26 28 32  GLUCOSE 114* 95 85  BUN 10 8 10   CREATININE 0.48 0.38* 0.46  CALCIUM 8.6* 8.2* 8.2*  MG  --  2.2  --   AST 32  --   --   ALT 27  --   --   ALKPHOS 81  --   --   BILITOT 0.6  --   --     Cardiac Enzymes No results for input(s): TROPONINI in the last 168 hours. RADIOLOGY:  No results found.  Assessment and Plan  Erica Perry is a 88 y.o. female with medical history significant of hypothyroidism, hypertension, hyperlipidemia, COPD not on chronic oxygen, former tobacco use, chronic benzodiazepine use on Ativan, GERD, ambulatory dysfunction uses a walker, osteoarthritis.  patient presents to the emergency department after having recurrent falls at home.  Left knee fracture status post mechanical fall  Imaging shows minimally impacted insufficiency fracture involving the medial  tibial metaphysis, with slight buckling of the cortex and linears clerosis in the medial tibial plateau as above.  --PT/OT consult-- recommends rehab -- [facility] consulted for [facility].  --consult with Ortho. Dr. 97 recommended knee immobilizer and limited weight bearing with walker. -- PRN pain meds   Recurrent falls:  --Appears to be mechanical.   --Fall precautions ordered.  --CT head on 5/28 shows right posterior scalp hematoma.  --Hold DVT PPX.   Hyponatremia: Likely due to low solute intake.  --Asymptomatic. Sodium 125-- IV fluids. Sodium is 130. Discontinue IV fluids. -- Previous sodium 131-134 on Care Everywhere.    Acute hypoxic respiratory failure: Mild. Now on 2L. BNP elevated at 331. Previous ECHO on 03/2019 showed LVEF 55-60 --patient denies any shortness of breath.-- Respiratory failure resolved.  -- Wean to room air  Hypothyroidism: Continue home Synthroid.     Chronic benzodiazepine use: Continue home Ativan   Hypertension: Continue home Cozaar and Coreg   Hyperlipidemia: Continue home Zocor   GERD: Continue home Pepcid   overall hemodynamically stable. TOC for discharge planning to rehab once bed available   Procedures: none Family communication : spoke with daughter 6/28 yday Consults : orthopedic CODE STATUS: DNR DVT Prophylaxis : SCD Level of care: Med-Surg Status is: Inpatient Remains inpatient appropriate because: awaiting placement on Sunday per TOC    TOTAL TIME  TAKING CARE OF THIS PATIENT: 35 minutes.  >50% time spent on counselling and coordination of care  Note: This dictation was prepared with Dragon dictation along with smaller phrase technology. Any transcriptional errors that result from this process are unintentional.  Enedina Finner M.D    [facility]   CC: Primary care physician; Gracelyn Nurse, MD

## 2021-08-11 NOTE — Progress Notes (Signed)
Mobility Specialist - Progress Note    08/11/21 1400  Mobility  Activity Transferred to/from [facility];Transferred from chair to bed;Ambulated with assistance in room  Level of Assistance Minimal assist, patient does 75% or more  Assistive Device Front wheel walker  Distance Ambulated (ft) 10 ft  Activity Response Tolerated well  $Mobility charge 1 Mobility     Pt sitting in recliner upon arrival using RA. Completes C-BSC transfer MinA with PWB on LLE. Pt ambulates 81ft around bed ---- PWB throughout and tolerates well. Pt returns to bed with needs in reach and bed alarm set.  Clarisa Schools Mobility Specialist 08/11/21, 3:09 PM

## 2021-08-12 DIAGNOSIS — E871 Hypo-osmolality and hyponatremia: Secondary | ICD-10-CM | POA: Diagnosis not present

## 2021-08-12 MED ORDER — ADULT MULTIVITAMIN W/MINERALS CH
1.0000 | ORAL_TABLET | Freq: Every day | ORAL | 1 refills | Status: AC
Start: 2021-08-13 — End: ?

## 2021-08-12 MED ORDER — LOSARTAN POTASSIUM 50 MG PO TABS: 50.0000 mg | ORAL_TABLET | Freq: Every day | ORAL | Status: DC

## 2021-08-12 MED ORDER — TRAMADOL HCL 50 MG PO TABS: 50.0000 mg | ORAL_TABLET | Freq: Four times a day (QID) | ORAL | 0 refills | Status: AC | PRN

## 2021-08-12 MED ORDER — LOSARTAN POTASSIUM 50 MG PO TABS: 50.0000 mg | ORAL_TABLET | Freq: Every day | ORAL | 1 refills | Status: AC

## 2021-08-12 MED ORDER — CARVEDILOL 6.25 MG PO TABS: 6.2500 mg | ORAL_TABLET | Freq: Two times a day (BID) | ORAL | 1 refills | Status: AC

## 2021-08-12 MED ORDER — ENSURE ENLIVE PO LIQD: 237.0000 mL | Freq: Two times a day (BID) | ORAL | 12 refills | Status: AC

## 2021-08-12 NOTE — TOC Progression Note (Signed)
Transition of Care [facility]) - Progression Note    Patient Details  Name: Erica Perry MRN: [id number] Date of Birth: 10-09-1924  Transition of Care [facility]) CM/SW Contact  Liliana Cline, LCSW Phone Number: 08/12/2021, 9:11 AM  Clinical Narrative:    Call from daughter [name], provided update.  Attempted calls x 2 to [facility], phone just kept ringing. Left VM for Admissions Worker Logan requesting return call with room # and # for report. DC Summary has already been faxed to [facility] by this CSW.  Expected Discharge Plan: Skilled Nursing Facility Barriers to Discharge: Continued Medical Work up  Expected Discharge Plan and Services Expected Discharge Plan: Skilled Nursing Facility   Discharge Planning Services: CM Consult Post Acute Care Choice: Skilled Nursing Facility Living arrangements for the past 2 months: Apartment Expected Discharge Date: 08/12/21               DME Arranged: N/A DME Agency: NA       HH Arranged: NA HH Agency: NA         Social Determinants of Health (SDOH) Interventions    Readmission Risk Interventions     View : No data to display.

## 2021-08-12 NOTE — Progress Notes (Signed)
Patient medically cleared with discharge orders placed by MD.  PIV removed prior to discharge with all discharge paperwork including signed DNR sent with EMS staff.  Patient taken off the unit on stretcher by EMS for transport to [facility] @1302 .

## 2021-08-12 NOTE — Progress Notes (Signed)
Report called to [facility] given to [facility] RN who will be receiving patient at the facility.

## 2021-08-12 NOTE — TOC Transition Note (Addendum)
Transition of Care [facility]) - CM/SW Discharge Note   Patient Details  Name: Erica Perry MRN: [id number] Date of Birth: 1924-09-04  Transition of Care [facility]) CM/SW Contact:  Magnus Ivan, LCSW Phone Number: 08/12/2021, 10:44 AM   Clinical Narrative:    Discharge to [facility] today. Room 315B. Confirmed with [facility]. Updated MD, RN, and daughter [name]. Asked RN to call report. EMS paperwork completed. EMS arranged through [employer], ([facility] refused).     Final next level of care: Skilled Nursing Facility Barriers to Discharge: Barriers Resolved   Patient Goals and CMS Choice Patient states their goals for this hospitalization and ongoing recovery are:: STR CMS Medicare.gov Compare Post Acute Care list provided to:: Patient Choice offered to / list presented to : Patient, Adult Children  Discharge Placement              Patient chooses bed at: [place] Patient to be transferred to facility by: [facility] Name of family member notified: [name] Patient and family notified of of transfer: 08/12/21  Discharge Plan and Services   Discharge Planning Services: CM Consult Post Acute Care Choice: [facility]          DME Arranged: N/A DME Agency: NA       HH Arranged: NA HH Agency: NA        Social Determinants of Health (SDOH) Interventions     Readmission Risk Interventions     View : No data to display.

## 2021-08-12 NOTE — Discharge Summary (Signed)
Physician Discharge Summary   Patient: Erica Perry MRN: [id number] DOB: May 28, 1924  Admit date:     08/07/2021  Discharge date: 08/12/21  Discharge Physician: Enedina Finner   PCP: Gracelyn Nurse, MD   Recommendations at discharge:    F/u PCP in 1-2 weeks F/u Dr Joice Lofts Ortho in 2 weeks Cont use of knee mobilizer  Discharge Diagnoses: Left knee fracture post fall--cont knee mobilizer  Hospital Course:  Erica Perry is a 88 y.o. female with medical history significant of hypothyroidism, hypertension, hyperlipidemia, COPD not on chronic oxygen, former tobacco use, chronic benzodiazepine use on Ativan, GERD, ambulatory dysfunction uses a walker, osteoarthritis.  patient presents to the emergency department after having recurrent falls at home.   Left knee fracture status post mechanical fall  Imaging shows minimally impacted insufficiency fracture involving the medial tibial metaphysis, with slight buckling of the cortex and linears clerosis in the medial tibial plateau as above.  --PT/OT consult-- recommends rehab -- [facility] consulted for placement.  --consult with Ortho. Dr. Joice Lofts recommended knee immobilizer and limited weight bearing with walker. -- PRN pain meds   Recurrent falls:  --Appears to be mechanical.   --Fall precautions ordered.  --CT head on 5/28 shows right posterior scalp hematoma.  --Hold DVT PPX.   Hyponatremia: Likely due to low solute intake.  --Asymptomatic. Sodium 125-- recievedIV fluids. Sodium is 130. Discontinue IV fluids. -- Previous sodium 131-134 on Care Everywhere.    Acute hypoxic respiratory failure: Mild. Now on 2L. BNP elevated at 331. Previous ECHO on 03/2019 showed LVEF 55-60 --patient denies any shortness of breath.-- Respiratory failure resolved.  -- Wean to room air   Hypothyroidism: Continue home Synthroid.     Chronic benzodiazepine use: Continue home Ativan   Hypertension: Continue home Cozaar and Coreg--dose adjusted    Hyperlipidemia: Continue home Zocor   GERD: Continue home Pepcid   overall hemodynamically stable. TOC for discharge planning to rehab today.      Consultants: ortho--dr poggi Procedures performed: none  Disposition: Rehabilitation facility Diet recommendation:  Discharge Diet Orders (From admission, onward)     Start     Ordered   08/12/21 0000  Diet - low sodium heart healthy        08/12/21 0855           Cardiac diet DISCHARGE MEDICATION: Allergies as of 08/12/2021       Reactions   Meperidine And Related Other (See Comments)   Cannot remember   Midazolam Hcl    Sulfa Antibiotics    Aleve [naproxen Sodium] Rash   Codeine Sulfate Rash        Medication List     TAKE these medications    acetaminophen 500 MG tablet Commonly known as: TYLENOL Take 250 mg by mouth daily as needed for pain.   aspirin 81 MG tablet Take 81 mg by mouth daily.   carvedilol 6.25 MG tablet Commonly known as: COREG Take 1 tablet (6.25 mg total) by mouth 2 (two) times daily with a meal. What changed:  medication strength how much to take   famotidine 20 MG tablet Commonly known as: PEPCID Take 20 mg by mouth 2 (two) times daily.   feeding supplement Liqd Take 237 mLs by mouth 2 (two) times daily between meals.   levothyroxine 75 MCG tablet Commonly known as: SYNTHROID Take 75 mcg by mouth daily before breakfast.   LORazepam 0.5 MG tablet Commonly known as: ATIVAN Take 1 tablet by mouth 2 (two) times  daily.   losartan 50 MG tablet Commonly known as: COZAAR Take 1 tablet (50 mg total) by mouth daily. Start taking on: August 13, 2021 What changed:  medication strength how much to take   multivitamin with minerals Tabs tablet Take 1 tablet by mouth daily. Start taking on: August 13, 2021   polyethylene glycol powder 17 GM/SCOOP powder Commonly known as: GLYCOLAX/MIRALAX 1/2 -1 capful once a day with water to prevent constipation.  May put powder in coffee or water    simvastatin 40 MG tablet Commonly known as: ZOCOR Take 1 tablet (40 mg total) by mouth daily.   timolol 0.5 % ophthalmic solution Commonly known as: TIMOPTIC Place 1 drop into both eyes daily.   traMADol 50 MG tablet Commonly known as: ULTRAM Take 1 tablet (50 mg total) by mouth every 6 (six) hours as needed.        Discharge Exam: Filed Weights   08/07/21 1429  Weight: 43.1 kg     Condition at discharge: fair  The results of significant diagnostics from this hospitalization (including imaging, microbiology, ancillary and laboratory) are listed below for reference.   Imaging Studies: DG Chest 1 View  Result Date: 08/07/2021 CLINICAL DATA:  Multiple falls EXAM: CHEST  1 VIEW COMPARISON:  03/09/2016 FINDINGS: Single frontal view of the chest demonstrates a stable cardiac silhouette. Chronic ectasia and atherosclerosis of the thoracic aorta. No acute airspace disease, effusion, or pneumothorax. No acute displaced fracture. IMPRESSION: 1. No acute intrathoracic process. Electronically Signed   By: Sharlet Salina M.D.   On: 08/07/2021 19:17   DG Pelvis 1-2 Views  Result Date: 08/07/2021 CLINICAL DATA:  Worsening left knee and hip pain after fall EXAM: PELVIS - 1-2 VIEW COMPARISON:  None Available. FINDINGS: Supine frontal view of the pelvis was performed. No acute fracture, subluxation, or dislocation. Moderate symmetrical bilateral hip osteoarthritis. Sacroiliac joints are unremarkable. Soft tissues are grossly normal. IMPRESSION: 1. Symmetrical bilateral hip osteoarthritis.  No acute fracture. Electronically Signed   By: Sharlet Salina M.D.   On: 08/07/2021 15:17   CT HEAD WO CONTRAST ( )  Result Date: 08/05/2021 CLINICAL DATA:  Head trauma, minor.  Neck trauma. EXAM: CT HEAD WITHOUT CONTRAST CT CERVICAL SPINE WITHOUT CONTRAST TECHNIQUE: Multidetector CT imaging of the head and cervical spine was performed following the standard protocol without intravenous contrast.  Multiplanar CT image reconstructions of the cervical spine were also generated. RADIATION DOSE REDUCTION: This exam was performed according to the departmental dose-optimization program which includes automated exposure control, adjustment of the mA and/or kV according to patient size and/or use of iterative reconstruction technique. COMPARISON:  05/26/2013 FINDINGS: CT HEAD FINDINGS Brain: No evidence of acute infarction, hemorrhage, hydrocephalus, extra-axial collection or mass lesion/mass effect. Remote right posterior frontal cortex infarct, interval since 2015. Cerebral volume loss and chronic small vessel ischemic gliosis is also progressed since that time Vascular: No hyperdense vessel or unexpected calcification. Skull: Right posterior scalp hematoma.  No calvarial fracture. Sinuses/Orbits: Bilateral cataract resection CT CERVICAL SPINE FINDINGS Alignment: No traumatic malalignment. Skull base and vertebrae: Generalized osteopenia.  No acute fracture Soft tissues and spinal canal: No prevertebral fluid or swelling. No visible canal hematoma. Disc levels:  Ordinary degenerative changes for age Upper chest: No visible injury IMPRESSION: 1. No evidence of acute intracranial or cervical spine injury. 2. Right posterior scalp hematoma without calvarial fracture. 3. Aging of the brain since comparison in 2015. Remote right frontal infarct. Electronically Signed   By: Audry Riles.D.  On: 08/05/2021 05:57   CT Cervical Spine Wo Contrast  Result Date: 08/05/2021 CLINICAL DATA:  Head trauma, minor.  Neck trauma. EXAM: CT HEAD WITHOUT CONTRAST CT CERVICAL SPINE WITHOUT CONTRAST TECHNIQUE: Multidetector CT imaging of the head and cervical spine was performed following the standard protocol without intravenous contrast. Multiplanar CT image reconstructions of the cervical spine were also generated. RADIATION DOSE REDUCTION: This exam was performed according to the departmental dose-optimization program which  includes automated exposure control, adjustment of the mA and/or kV according to patient size and/or use of iterative reconstruction technique. COMPARISON:  05/26/2013 FINDINGS: CT HEAD FINDINGS Brain: No evidence of acute infarction, hemorrhage, hydrocephalus, extra-axial collection or mass lesion/mass effect. Remote right posterior frontal cortex infarct, interval since 2015. Cerebral volume loss and chronic small vessel ischemic gliosis is also progressed since that time Vascular: No hyperdense vessel or unexpected calcification. Skull: Right posterior scalp hematoma.  No calvarial fracture. Sinuses/Orbits: Bilateral cataract resection CT CERVICAL SPINE FINDINGS Alignment: No traumatic malalignment. Skull base and vertebrae: Generalized osteopenia.  No acute fracture Soft tissues and spinal canal: No prevertebral fluid or swelling. No visible canal hematoma. Disc levels:  Ordinary degenerative changes for age Upper chest: No visible injury IMPRESSION: 1. No evidence of acute intracranial or cervical spine injury. 2. Right posterior scalp hematoma without calvarial fracture. 3. Aging of the brain since comparison in 2015. Remote right frontal infarct. Electronically Signed   By: Tiburcio Pea M.D.   On: 08/05/2021 05:57   CT Knee Left Wo Contrast  Result Date: 07/16/2021 CLINICAL DATA:  Knee pain, stress fracture suspected, neg xray tibial plateau fracture suspected EXAM: CT OF THE LEFT KNEE WITHOUT CONTRAST TECHNIQUE: Multidetector CT imaging of the left knee was performed according to the standard protocol. Multiplanar CT image reconstructions were also generated. RADIATION DOSE REDUCTION: This exam was performed according to the departmental dose-optimization program which includes automated exposure control, adjustment of the mA and/or kV according to patient size and/or use of iterative reconstruction technique. COMPARISON:  Left knee radiograph 07/16/2021 FINDINGS: Bones/Joint/Cartilage Osteopenia.  There is no evidence of acute fracture. Alignment is normal. There is no significant joint effusion. There is tricompartment osteoarthritis, most prominent in the medial and patellofemoral compartments. Ligaments Suboptimally assessed by CT. Muscles and Tendons No acute myotendinous abnormality by CT. Soft tissues No focal fluid collection. IMPRESSION: No evidence of acute fracture.  No significant joint effusion. Tricompartment osteoarthritis worst in the medial and patellofemoral compartments. Diffuse osteopenia. Electronically Signed   By: Caprice Renshaw M.D.   On: 07/16/2021 17:05   DG Chest Portable 1 View  Result Date: 08/08/2021 CLINICAL DATA:  Mild hypoxia rest EXAM: PORTABLE CHEST 1 VIEW COMPARISON:  Chest x-ray dated Aug 07, 2021 FINDINGS: Cardiac and mediastinal contours are unchanged. Unchanged interstitial opacities which are most pronounced in the upper lungs. No focal consolidation. No large pleural effusion or pneumothorax. IMPRESSION: Unchanged interstitial opacities which are most pronounced in the upper lungs, differential considerations include pulmonary edema or underlying chronic lung disease. No focal consolidation. Electronically Signed   By: Allegra Lai M.D.   On: 08/08/2021 08:39   DG Knee Complete 4 Views Left  Result Date: 08/07/2021 CLINICAL DATA:  Worsening left knee and hip pain, multiple falls EXAM: LEFT KNEE - COMPLETE 4+ VIEW COMPARISON:  07/16/2021 FINDINGS: Frontal, bilateral oblique, and cross-table lateral views of the left knee are obtained. Bones are diffusely osteopenic. Since the previous exam, linear sclerosis has developed within the medial tibial plateau perpendicular to the  trabecular lines, with subtle buckling of the medial tibial metaphyseal cortex. Findings are consistent with insufficiency fracture. No other acute bony abnormalities. Stable mild 3 compartmental osteoarthritis. Trace left knee effusion has developed. There is diffuse atherosclerosis.  IMPRESSION: 1. Minimally impacted insufficiency fracture involving the medial tibial metaphysis, with slight buckling of the cortex and linear sclerosis in the medial tibial plateau as above. This has developed in the interim since prior exams. 2. Trace left knee effusion. Electronically Signed   By: Sharlet Salina M.D.   On: 08/07/2021 15:20   DG Knee Complete 4 Views Left  Result Date: 07/16/2021 CLINICAL DATA:  Left knee pain after fall. EXAM: LEFT KNEE - COMPLETE 4+ VIEW COMPARISON:  February 03, 2015. FINDINGS: No evidence of fracture, dislocation, or joint effusion. No evidence of arthropathy or other focal bone abnormality. Soft tissues are unremarkable. IMPRESSION: Negative. Electronically Signed   By: Lupita Raider M.D.   On: 07/16/2021 16:34   ECHOCARDIOGRAM COMPLETE  Result Date: 08/09/2021    ECHOCARDIOGRAM REPORT   Patient Name:   Erica Perry Date of Exam: 08/09/2021 Medical Rec #:  [id number]        Height:       63.0 in Accession #:    [id number]       Weight:       95.0 lb Date of Birth:  1924-04-04        BSA:          1.409 m Patient Age:    89 years         BP:           158/94 mmHg Patient Gender: F                HR:           73 bpm. Exam Location:  Inpatient Procedure: 2D Echo, Cardiac Doppler and Color Doppler Indications:     Dyspnea R06.00  History:         Patient has prior history of Echocardiogram examinations, most                  recent 12/16/2019. COPD; Risk Factors:Former Smoker,                  Hypertension and Dyslipidemia.  Sonographer:     Leta Jungling RDCS Referring Phys:  [id number] Verdia Kuba Diagnosing Phys: Yvonne Kendall MD IMPRESSIONS  1. Left ventricular ejection fraction, by estimation, is 60 to 65%. The left ventricle has normal function. The left ventricle has no regional wall motion abnormalities. Left ventricular diastolic parameters are consistent with Grade II diastolic dysfunction (pseudonormalization). Elevated left atrial pressure.  2. Right  ventricular systolic function is normal. The right ventricular size is normal. There is moderately elevated pulmonary artery systolic pressure.  3. Left atrial size was severely dilated.  4. Right atrial size was mildly dilated.  5. A small pericardial effusion is present. The pericardial effusion is anterior to the right ventricle.  6. The mitral valve is degenerative. Mild to moderate mitral valve regurgitation. No evidence of mitral stenosis. There is mild late systolic prolapse of posterior leaflet of the mitral valve.  7. The tricuspid valve is degenerative. Tricuspid valve regurgitation is moderate to severe.  8. The aortic valve is tricuspid. There is moderate calcification of the aortic valve. There is mild thickening of the aortic valve. Aortic valve regurgitation is trivial. Aortic valve sclerosis/calcification is present, without any evidence of aortic stenosis.  Aortic valve mean gradient measures 3.5 mmHg.  9. There is mild dilatation of the ascending aorta, measuring 37 mm. 10. The inferior vena cava is normal in size with <50% respiratory variability, suggesting right atrial pressure of 8 mmHg. FINDINGS  Left Ventricle: Left ventricular ejection fraction, by estimation, is 60 to 65%. The left ventricle has normal function. The left ventricle has no regional wall motion abnormalities. The left ventricular internal cavity size was normal in size. There is  no left ventricular hypertrophy. Left ventricular diastolic parameters are consistent with Grade II diastolic dysfunction (pseudonormalization). Elevated left atrial pressure. Right Ventricle: The right ventricular size is normal. No increase in right ventricular wall thickness. Right ventricular systolic function is normal. There is moderately elevated pulmonary artery systolic pressure. The tricuspid regurgitant velocity is 3.42 m/s, and with an assumed right atrial pressure of 8 mmHg, the estimated right ventricular systolic pressure is 54.8 mmHg.  Left Atrium: Left atrial size was severely dilated. Right Atrium: Right atrial size was mildly dilated. Pericardium: A small pericardial effusion is present. The pericardial effusion is anterior to the right ventricle. Presence of epicardial fat layer. Mitral Valve: The mitral valve is degenerative in appearance. There is mild late systolic prolapse of posterior leaflet of the mitral valve. Mild mitral annular calcification. Mild to moderate mitral valve regurgitation. No evidence of mitral valve stenosis. Tricuspid Valve: The tricuspid valve is degenerative in appearance. Tricuspid valve regurgitation is moderate to severe. Aortic Valve: The aortic valve is tricuspid. There is moderate calcification of the aortic valve. There is mild thickening of the aortic valve. Aortic valve regurgitation is trivial. Aortic valve sclerosis/calcification is present, without any evidence of aortic stenosis. Aortic valve mean gradient measures 3.5 mmHg. Aortic valve peak gradient measures 6.5 mmHg. Pulmonic Valve: The pulmonic valve was not well visualized. Pulmonic valve regurgitation is not visualized. No evidence of pulmonic stenosis. Aorta: The aortic root is normal in size and structure. There is mild dilatation of the ascending aorta, measuring 37 mm. Pulmonary Artery: The pulmonary artery is of normal size. Venous: The inferior vena cava is normal in size with less than 50% respiratory variability, suggesting right atrial pressure of 8 mmHg. IAS/Shunts: The interatrial septum was not well visualized.  LEFT VENTRICLE PLAX 2D LVIDd:         3.70 cm   Diastology LVIDs:         2.50 cm   LV e' medial:    30.44 cm/s LV PW:         0.80 cm   LV E/e' medial:  1.9 LV IVS:        0.80 cm   LV e' lateral:   5.66 cm/s LVOT diam:     2.00 cm   LV E/e' lateral: 10.1 LV SV:         47 LV SV Index:   34 LVOT Area:     3.14 cm  RIGHT VENTRICLE RV S prime:     12.70 cm/s TAPSE (M-mode): 1.2 cm LEFT ATRIUM              Index LA diam:         3.80 cm  2.70 cm/m LA Vol (A2C):   61.5 ml  43.64 ml/m LA Vol (A4C):   105.0 ml 74.51 ml/m LA Biplane Vol: 79.6 ml  56.49 ml/m  AORTIC VALVE AV Area (Vmax): 1.79 cm AV Vmax:        127.00 cm/s AV Peak Grad:   6.5 mmHg  AV Mean Grad:   3.5 mmHg LVOT Vmax:      72.50 cm/s LVOT Vmean:     49.000 cm/s LVOT VTI:       0.151 m  AORTA Ao Root diam: 3.00 cm Ao Asc diam:  3.70 cm MITRAL VALVE               TRICUSPID VALVE MV Area (PHT): 3.77 cm    TR Peak grad:   46.8 mmHg MV Decel Time: 201 msec    TR Vmax:        342.00 cm/s MV E velocity: 57.00 cm/s MV A velocity: 49.30 cm/s  SHUNTS MV E/A ratio:  1.16        Systemic VTI:  0.15 m                            Systemic Diam: 2.00 cm Yvonne Kendall MD Electronically signed by Yvonne Kendall MD Signature Date/Time: 08/09/2021/12:50:37 PM    Final     Microbiology: Results for orders placed or performed during the hospital encounter of 11/19/18  SARS Coronavirus 2 [facility] order, Performed in [facility] hospital lab) Nasopharyngeal Nasopharyngeal Swab     Status: None   Collection Time: 11/19/18 10:24 AM   Specimen: Nasopharyngeal Swab  Result Value Ref Range Status   SARS Coronavirus 2 NEGATIVE NEGATIVE Final    Comment: (NOTE) If result is NEGATIVE SARS-CoV-2 target nucleic acids are NOT DETECTED. The SARS-CoV-2 RNA is generally detectable in upper and lower  respiratory specimens during the acute phase of infection. The lowest  concentration of SARS-CoV-2 viral copies this assay can detect is 250  copies / mL. A negative result does not preclude SARS-CoV-2 infection  and should not be used as the sole basis for treatment or other  patient management decisions.  A negative result may occur with  improper specimen collection / handling, submission of specimen other  than nasopharyngeal swab, presence of viral mutation(s) within the  areas targeted by this assay, and inadequate number of viral copies  (<250 copies / mL). A negative result must be  combined with clinical  observations, patient history, and epidemiological information. If result is POSITIVE SARS-CoV-2 target nucleic acids are DETECTED. The SARS-CoV-2 RNA is generally detectable in upper and lower  respiratory specimens dur ing the acute phase of infection.  Positive  results are indicative of active infection with SARS-CoV-2.  Clinical  correlation with patient history and other diagnostic information is  necessary to determine patient infection status.  Positive results do  not rule out bacterial infection or co-infection with other viruses. If result is PRESUMPTIVE POSTIVE SARS-CoV-2 nucleic acids MAY BE PRESENT.   A presumptive positive result was obtained on the submitted specimen  and confirmed on repeat testing.  While 2019 novel coronavirus  (SARS-CoV-2) nucleic acids may be present in the submitted sample  additional confirmatory testing may be necessary for epidemiological  and / or clinical management purposes  to differentiate between  SARS-CoV-2 and other Sarbecovirus currently known to infect humans.  If clinically indicated additional testing with an alternate test  methodology [phone]) is advised. The SARS-CoV-2 RNA is generally  detectable in upper and lower respiratory sp ecimens during the acute  phase of infection. The expected result is Negative. Fact Sheet for Patients:  BoilerBrush.com.cy Fact Sheet for Healthcare Providers: https://pope.com/ This test is not yet approved or cleared by the [place] FDA and has been authorized for  detection and/or diagnosis of SARS-CoV-2 by FDA under an Emergency Use Authorization (EUA).  This EUA will remain in effect (meaning this test can be used) for the duration of the COVID-19 declaration under Section 564(b)(1) of the Act, 21 U.S.C. section 360bbb-3(b)(1), unless the authorization is terminated or revoked sooner. Performed at [facility],  [address] [place], [place] [address]     Labs: CBC: Recent Labs  Lab 08/07/21 2019 08/08/21 1140 08/09/21 0437  WBC 10.3 9.1 8.2  NEUTROABS 7.9*  --   --   HGB 11.6* 11.5* 10.3*  HCT 36.2 35.4* 32.0*  MCV 91.9 91.9 90.9  PLT 265 261 257   Basic Metabolic Panel: Recent Labs  Lab 08/07/21 2019 08/08/21 1140 08/09/21 0437  NA 126* 125* 130*  K 4.4 4.2 4.5  CL 90* 89* 94*  CO2 26 28 32  GLUCOSE 114* 95 85  BUN 10 8 10   CREATININE 0.48 0.38* 0.46  CALCIUM 8.6* 8.2* 8.2*  MG  --  2.2  --   PHOS  --  4.0  --    Liver Function Tests: Recent Labs  Lab 08/07/21 2019  AST 32  ALT 27  ALKPHOS 81  BILITOT 0.6  PROT 6.2*  ALBUMIN 3.4*   CBG: No results for input(s): GLUCAP in the last 168 hours.  Discharge time spent: greater than 30 minutes.  Signed: 08/09/21, MD [facility] 08/12/2021

## 2021-08-12 NOTE — Progress Notes (Signed)
Mobility Specialist - Progress Note   08/12/21 1200  Mobility  Activity Stood at bedside;Transferred from bed to chair  Level of Assistance Minimal assist, patient does 75% or more  Assistive Device Front wheel walker  LLE Weight Bearing PWB  Distance Ambulated (ft) 0 ft  Activity Response Tolerated well  $Mobility charge 1 Mobility     Pt long sitting in bed upon arrival using RA. Completes bed mobility MinA and STS MinA PWB to completes B-C transfer. Donned clothes and socks ModA and is left in recliner with alarm set prepared for discharge.  Clarisa Schools Mobility Specialist 08/12/21, 1:02 PM

## 2021-11-09 DEATH — deceased

## 2022-05-31 IMAGING — DX DG KNEE COMPLETE 4+V*L*
4 series · 4 of 4 positions shown · non-contrast
Comparison: 07/16/2021

CLINICAL DATA: Worsening left knee and hip pain, multiple falls

EXAM:
LEFT KNEE - COMPLETE 4+ VIEW

[knee ap]
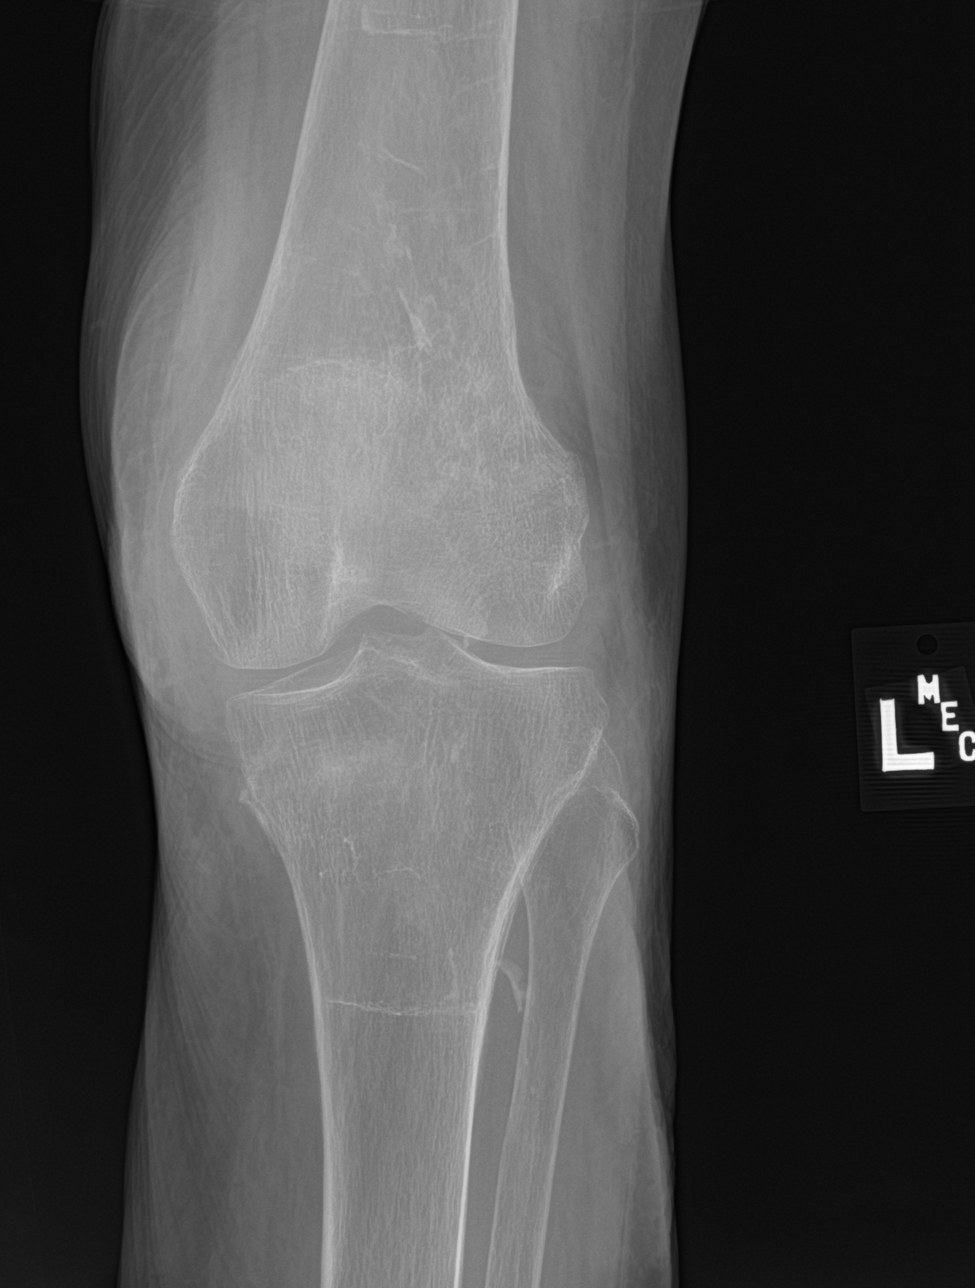

[knee lat]
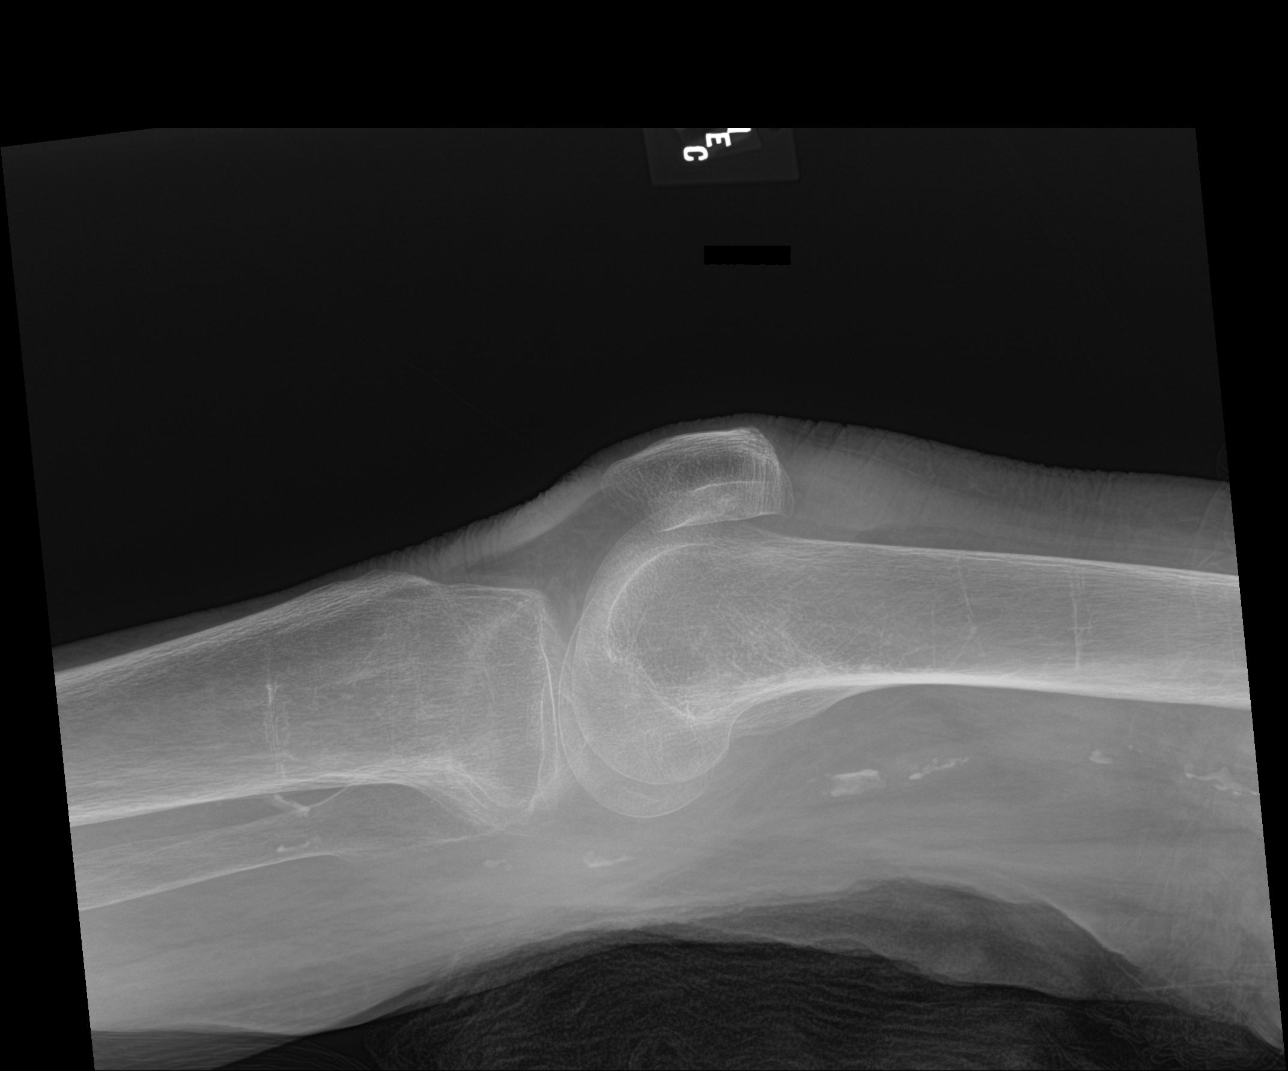

[knee obl (1 of 2)]
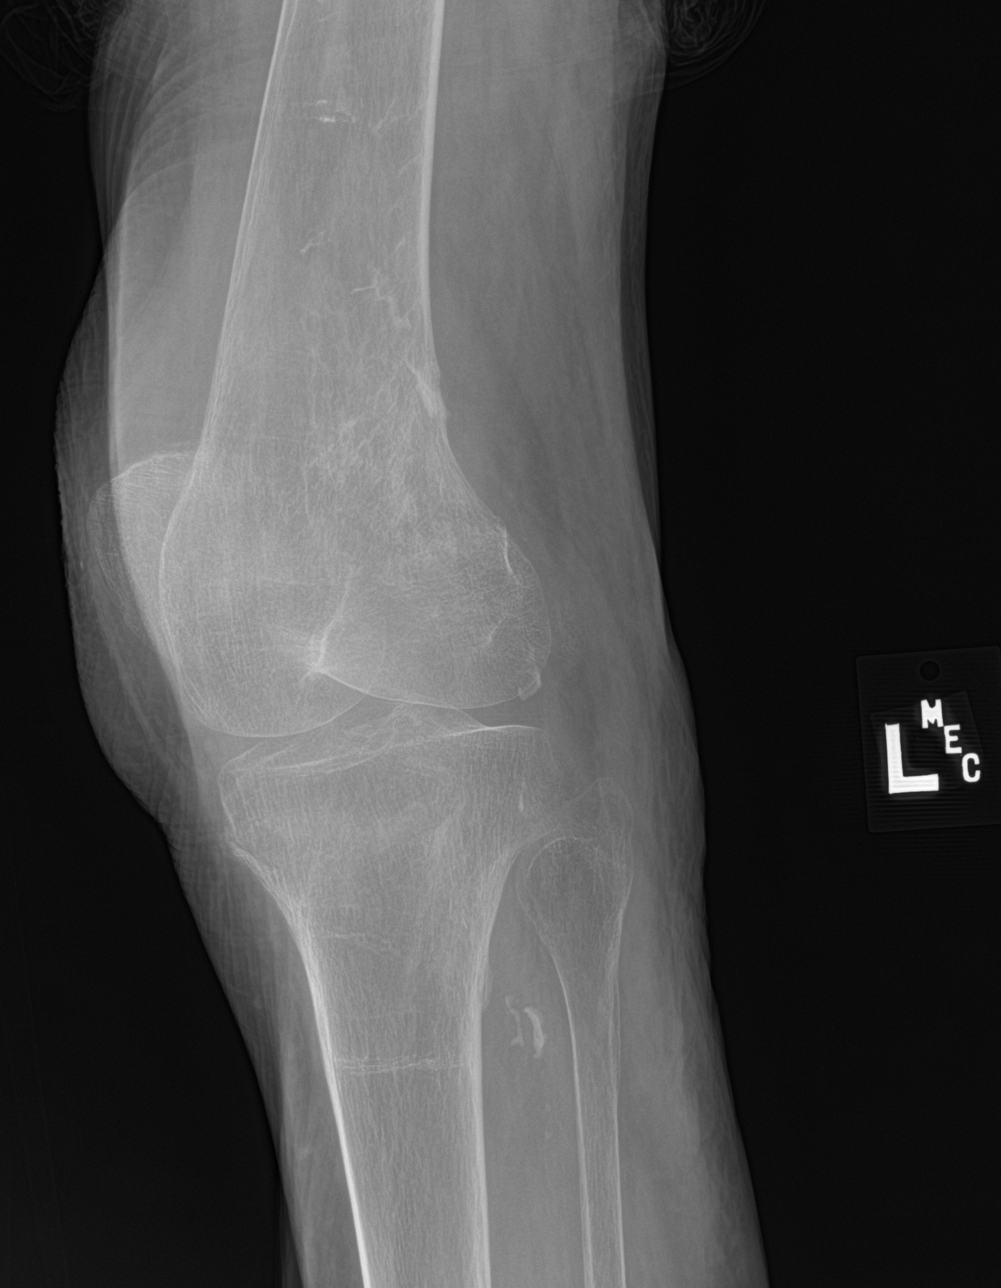

[knee obl (2 of 2)]
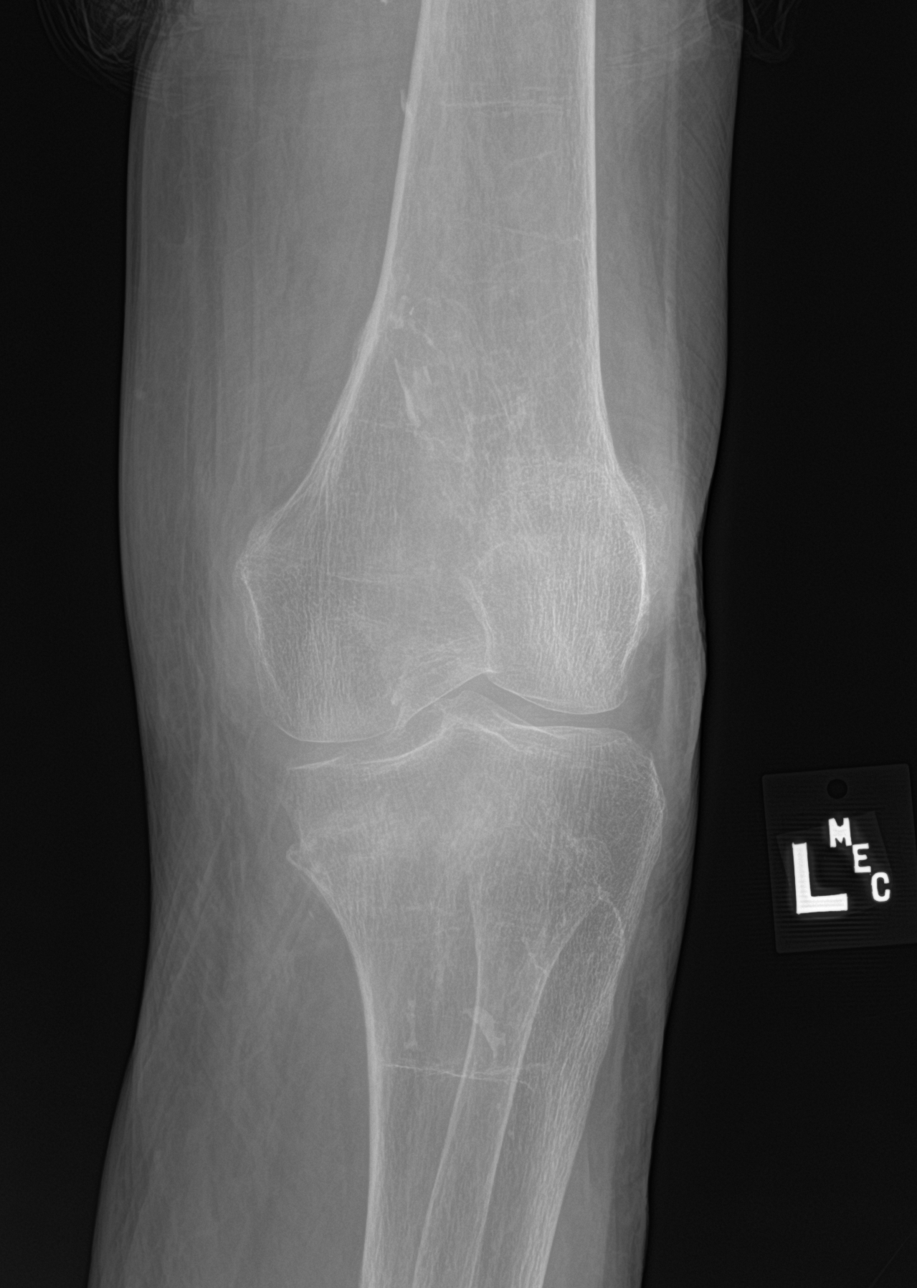

[4 of 4 positions shown; findings below may reference images not displayed]

FINDINGS: Frontal, bilateral oblique, and cross-table lateral views of the
left knee are obtained. Bones are diffusely osteopenic. Since the
previous exam, linear sclerosis has developed within the medial
tibial plateau perpendicular to the trabecular lines, with subtle
buckling of the medial tibial metaphyseal cortex. Findings are
consistent with insufficiency fracture. No other acute bony
abnormalities. Stable mild 3 compartmental osteoarthritis. Trace
left knee effusion has developed. There is diffuse atherosclerosis.
IMPRESSION: 1. Minimally impacted insufficiency fracture involving the medial
tibial metaphysis, with slight buckling of the cortex and linear
sclerosis in the medial tibial plateau as above. This has developed
in the interim since prior exams.
2. Trace left knee effusion.

## 2022-06-01 IMAGING — DX DG CHEST 1V PORT
1 series · 1 of 1 positions shown · non-contrast
Comparison: Chest x-ray dated August 07, 2021

CLINICAL DATA: Mild hypoxia rest

EXAM:
PORTABLE CHEST 1 VIEW

[chest ap]
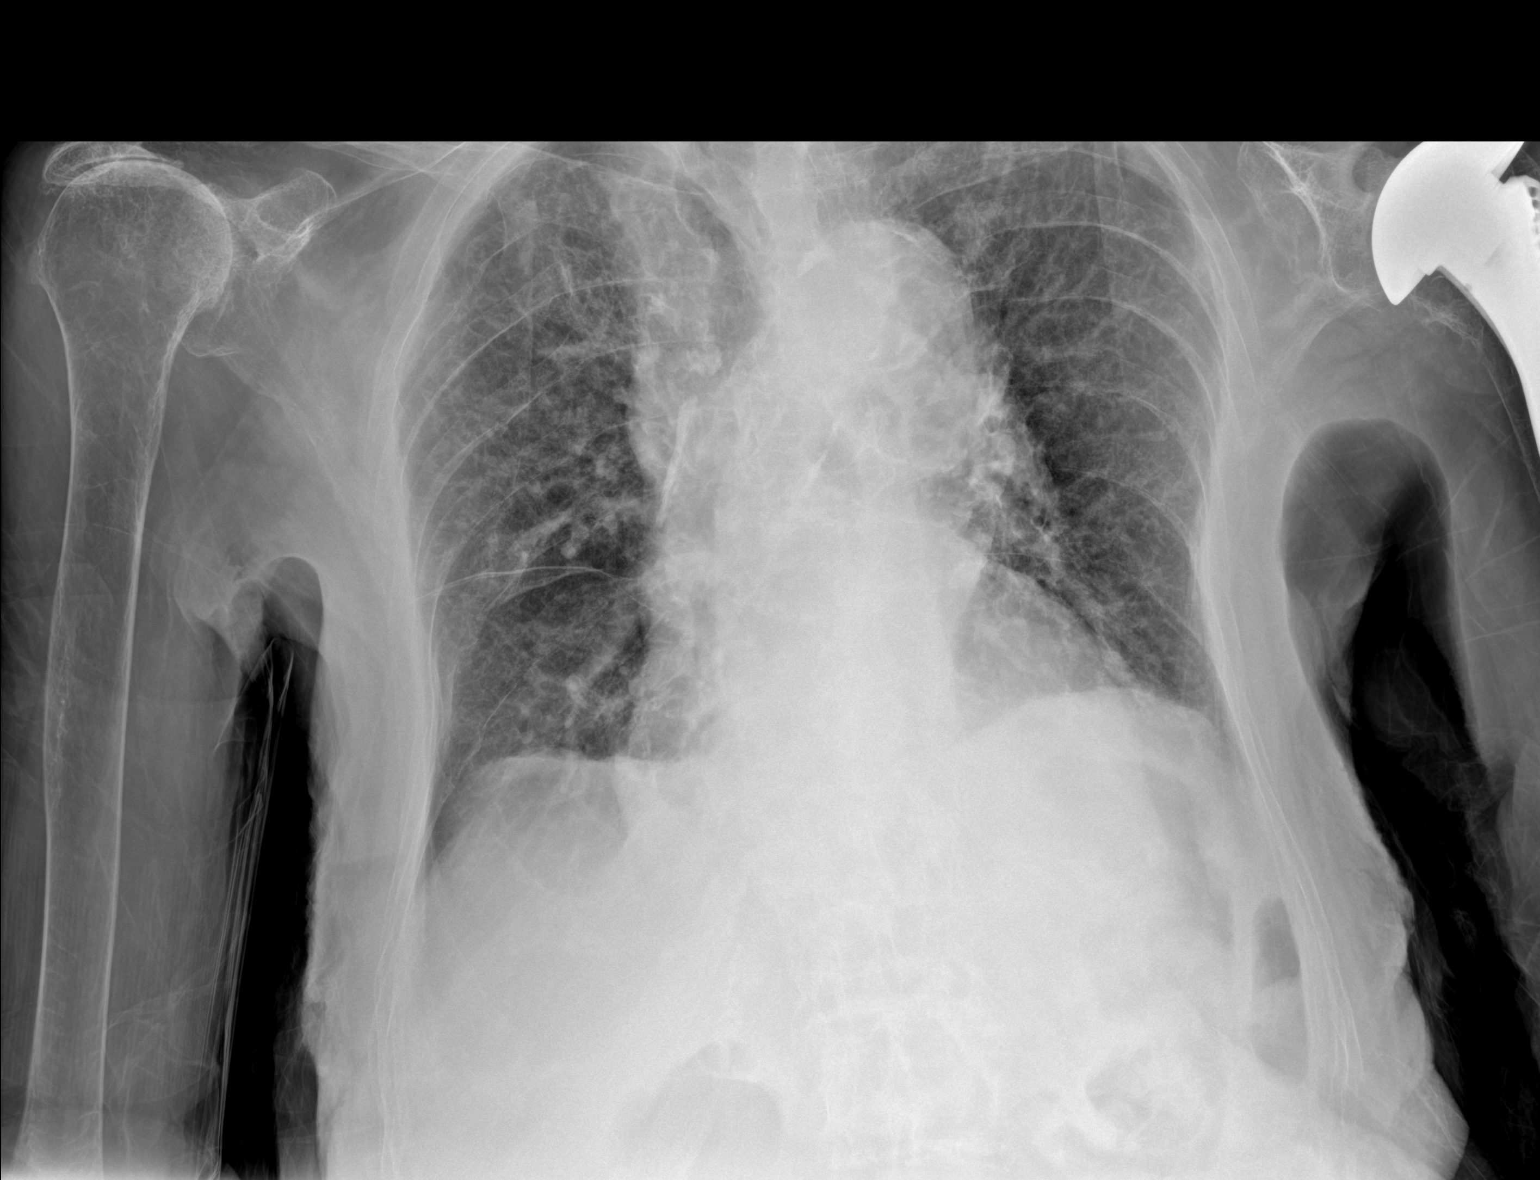

[1 of 1 positions shown; findings below may reference images not displayed]

FINDINGS: Cardiac and mediastinal contours are unchanged. Unchanged
interstitial opacities which are most pronounced in the upper lungs.
No focal consolidation. No large pleural effusion or pneumothorax.
IMPRESSION: Unchanged interstitial opacities which are most pronounced in the
upper lungs, differential considerations include pulmonary edema or
underlying chronic lung disease. No focal consolidation.
